# Patient Record
Sex: Female | Born: 1995
Health system: Southern US, Community
[De-identification: ages and names within clinical notes are randomized; demographics above are authoritative.]

## PROBLEM LIST (undated history)

## (undated) DIAGNOSIS — F329 Major depressive disorder, single episode, unspecified: Secondary | ICD-10-CM

## (undated) DIAGNOSIS — F419 Anxiety disorder, unspecified: Secondary | ICD-10-CM

## (undated) DIAGNOSIS — R569 Unspecified convulsions: Secondary | ICD-10-CM

## (undated) DIAGNOSIS — B029 Zoster without complications: Secondary | ICD-10-CM

## (undated) DIAGNOSIS — F32A Depression, unspecified: Secondary | ICD-10-CM

## (undated) DIAGNOSIS — D649 Anemia, unspecified: Secondary | ICD-10-CM

## (undated) DIAGNOSIS — Z8619 Personal history of other infectious and parasitic diseases: Secondary | ICD-10-CM

## (undated) DIAGNOSIS — F909 Attention-deficit hyperactivity disorder, unspecified type: Secondary | ICD-10-CM

## (undated) DIAGNOSIS — T7840XA Allergy, unspecified, initial encounter: Secondary | ICD-10-CM

## (undated) HISTORY — DX: Zoster without complications: B02.9

## (undated) HISTORY — DX: Anxiety disorder, unspecified: F41.9

## (undated) HISTORY — DX: Major depressive disorder, single episode, unspecified: F32.9

## (undated) HISTORY — DX: Depression, unspecified: F32.A

## (undated) HISTORY — DX: Attention-deficit hyperactivity disorder, unspecified type: F90.9

## (undated) HISTORY — DX: Personal history of other infectious and parasitic diseases: Z86.19

## (undated) HISTORY — DX: Unspecified convulsions: R56.9

## (undated) HISTORY — DX: Allergy, unspecified, initial encounter: T78.40XA

## (undated) HISTORY — PX: BREAST SURGERY: SHX581

## (undated) HISTORY — DX: Anemia, unspecified: D64.9

## (undated) HISTORY — PX: TONSILLECTOMY: SUR1361

---
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
## 2014-03-20 ENCOUNTER — Ambulatory Visit (INDEPENDENT_AMBULATORY_CARE_PROVIDER_SITE_OTHER): Payer: BC Managed Care – PPO | Admitting: Internal Medicine

## 2014-03-20 ENCOUNTER — Encounter: Payer: Self-pay | Admitting: Internal Medicine

## 2014-03-20 VITALS — BP 98/70 | HR 76 | Temp 98.4°F | Ht 66.25 in | Wt 120.0 lb

## 2014-03-20 DIAGNOSIS — A6000 Herpesviral infection of urogenital system, unspecified: Secondary | ICD-10-CM | POA: Insufficient documentation

## 2014-03-20 DIAGNOSIS — N920 Excessive and frequent menstruation with regular cycle: Secondary | ICD-10-CM

## 2014-03-20 DIAGNOSIS — N921 Excessive and frequent menstruation with irregular cycle: Secondary | ICD-10-CM | POA: Insufficient documentation

## 2014-03-20 LAB — CBC WITH DIFFERENTIAL/PLATELET
BASOS ABS: 0 10*3/uL (ref 0.0–0.1)
Basophils Relative: 0.2 % (ref 0.0–3.0)
Eosinophils Absolute: 0.1 10*3/uL (ref 0.0–0.7)
Eosinophils Relative: 2 % (ref 0.0–5.0)
HCT: 40 % (ref 36.0–49.0)
HEMOGLOBIN: 13.6 g/dL (ref 12.0–16.0)
LYMPHS PCT: 32.6 % (ref 24.0–48.0)
Lymphs Abs: 2.3 10*3/uL (ref 0.7–4.0)
MCHC: 33.9 g/dL (ref 31.0–37.0)
MCV: 87.7 fl (ref 78.0–98.0)
Monocytes Absolute: 0.6 10*3/uL (ref 0.1–1.0)
Monocytes Relative: 9.1 % (ref 3.0–12.0)
NEUTROS ABS: 4 10*3/uL (ref 1.4–7.7)
NEUTROS PCT: 56.1 % (ref 43.0–71.0)
Platelets: 243 10*3/uL (ref 150.0–575.0)
RBC: 4.56 Mil/uL (ref 3.80–5.70)
RDW: 12.6 % (ref 11.4–15.5)
WBC: 7.1 10*3/uL (ref 4.5–13.5)

## 2014-03-20 LAB — FERRITIN: Ferritin: 27.9 ng/mL (ref 10.0–291.0)

## 2014-03-20 LAB — COMPREHENSIVE METABOLIC PANEL
ALBUMIN: 4 g/dL (ref 3.5–5.2)
ALK PHOS: 81 U/L (ref 47–119)
ALT: 23 U/L (ref 0–35)
AST: 39 U/L — AB (ref 0–37)
BUN: 9 mg/dL (ref 6–23)
CALCIUM: 9.3 mg/dL (ref 8.4–10.5)
CO2: 26 mEq/L (ref 19–32)
Chloride: 105 mEq/L (ref 96–112)
Creatinine, Ser: 0.5 mg/dL (ref 0.4–1.2)
GFR: 154.61 mL/min (ref >60.00)
Glucose, Bld: 77 mg/dL (ref 70–99)
POTASSIUM: 3.7 meq/L (ref 3.5–5.1)
Sodium: 137 mEq/L (ref 135–145)
Total Bilirubin: 0.5 mg/dL (ref 0.3–1.2)
Total Protein: 6.5 g/dL (ref 6.0–8.3)

## 2014-03-20 LAB — TSH: TSH: 1.23 u[IU]/mL (ref 0.40–5.00)

## 2014-03-20 MED ORDER — ACYCLOVIR 800 MG PO TABS: 800.0000 mg | ORAL_TABLET | Freq: Two times a day (BID) | ORAL | Status: DC

## 2014-03-20 NOTE — Assessment & Plan Note (Addendum)
Likely menorrhagia related to decreased estrogen in Nexplanon, as this needs replaced. Will set up follow up with her OB. Will also request records from OB. Will check CBC and ferritin with labs.

## 2014-03-20 NOTE — Progress Notes (Signed)
Pre visit review using our clinic review tool, if applicable. No additional management support is needed unless otherwise documented below in the visit note. 

## 2014-03-20 NOTE — Patient Instructions (Addendum)
We will set up a visit with [facility].  Labs today.  Follow up in 4 weeks.

## 2014-03-20 NOTE — Progress Notes (Signed)
Subjective:    Patient ID: Laura Johnson, female    DOB: February 15, 1996, 18 y.o.   MRN: [id number]  HPI 17YO female presents to establish care as a new patient.  Has Nexplanon in place. Due to come out this October. Having heavy bleeding over the last several months. Feels tired and having some occasional aching headaches.  Has some occasional abdominal distension after eating. No specific food triggers. No constipation or diarrhea. No blood in stool.  Review of Systems  Constitutional: Negative for fever, chills, appetite change, fatigue and unexpected weight change.  Eyes: Negative for visual disturbance.  Respiratory: Negative for shortness of breath.   Cardiovascular: Negative for chest pain and leg swelling.  Gastrointestinal: Positive for abdominal distention. Negative for nausea, vomiting, abdominal pain, diarrhea and constipation.  Genitourinary: Positive for menstrual problem. Negative for dysuria, vaginal discharge, genital sores, vaginal pain and pelvic pain.  Skin: Negative for color change and rash.  Hematological: Negative for adenopathy. Does not bruise/bleed easily.  Psychiatric/Behavioral: Negative for dysphoric mood. The patient is not nervous/anxious.        Objective:    BP 98/70  Pulse 76  Temp(Src) 98.4 F (36.9 C) (Oral)  Ht 5' 6.25" (1.683 m)  Wt 120 lb (54.432 kg)  BMI 19.22 kg/m2  SpO2 95%  LMP 03/15/2014 Physical Exam  Constitutional: She is oriented to person, place, and time. She appears well-developed and well-nourished. No distress.  HENT:  Head: Normocephalic and atraumatic.  Right Ear: External ear normal.  Left Ear: External ear normal.  Nose: Nose normal.  Mouth/Throat: Oropharynx is clear and moist. No oropharyngeal exudate.  Eyes: Conjunctivae and EOM are normal. Pupils are equal, round, and reactive to light. Right eye exhibits no discharge.  Neck: Normal range of motion. Neck supple. No thyromegaly present.  Cardiovascular: Normal  rate, regular rhythm, normal heart sounds and intact distal pulses.  Exam reveals no gallop and no friction rub.   No murmur heard. Pulmonary/Chest: Effort normal. No respiratory distress. She has no wheezes. She has no rales.  Abdominal: Soft. Bowel sounds are normal. She exhibits no distension and no mass. There is no tenderness. There is no rebound and no guarding.  Musculoskeletal: Normal range of motion. She exhibits no edema and no tenderness.  Lymphadenopathy:    She has no cervical adenopathy.  Neurological: She is alert and oriented to person, place, and time. No cranial nerve deficit. Coordination normal.  Skin: Skin is warm and dry. No rash noted. She is not diaphoretic. No erythema. No pallor.  Psychiatric: She has a normal mood and affect. Her behavior is normal. Judgment and thought content normal.          Assessment & Plan:   Problem List Items Addressed This Visit     Unprioritized   Herpes genitalis     Symptoms well controlled with prn Acyclovir. Will continue.    Relevant Medications      acyclovir (ZOVIRAX) tablet   Menorrhagia with irregular cycle - Primary     Likely menorrhagia related to decreased estrogen in Nexplanon, as this needs replaced. Will set up follow up with her OB. Will also request records from OB.    Relevant Orders      CBC w/Diff      Ferritin      Comprehensive metabolic panel      TSH      Ambulatory referral to Gynecology       Return in about 4 weeks (around 04/17/2014) for  Recheck.

## 2014-03-20 NOTE — Assessment & Plan Note (Signed)
Symptoms well controlled with prn Acyclovir. Will continue.

## 2014-04-18 ENCOUNTER — Ambulatory Visit: Payer: BC Managed Care – PPO | Admitting: Internal Medicine

## 2014-05-29 ENCOUNTER — Ambulatory Visit: Payer: BC Managed Care – PPO | Admitting: Internal Medicine

## 2014-06-04 ENCOUNTER — Encounter: Payer: Self-pay | Admitting: Internal Medicine

## 2014-06-25 ENCOUNTER — Telehealth: Payer: Self-pay | Admitting: Internal Medicine

## 2014-06-25 NOTE — Telephone Encounter (Signed)
[facility] [employer] [facility]. Labs show normal blood counts and HCG is positive, consistent with pregnancy at 4-5 weeks. Can you please make sure that pt received these results from Tammy? We can set up OB referral if she needs this.

## 2014-06-25 NOTE — Telephone Encounter (Signed)
Left vm for pt to return my call.  

## 2014-06-26 ENCOUNTER — Encounter: Payer: Self-pay | Admitting: Internal Medicine

## 2014-06-26 NOTE — Telephone Encounter (Signed)
Pt sent mychart for results. Responded back through [employer]

## 2014-07-06 ENCOUNTER — Emergency Department: Payer: Self-pay | Admitting: Emergency Medicine

## 2014-07-07 LAB — CBC WITH DIFFERENTIAL/PLATELET
BASOS ABS: 0.1 10*3/uL (ref 0.0–0.1)
BASOS PCT: 0.6 %
Eosinophil #: 0.1 10*3/uL (ref 0.0–0.7)
Eosinophil %: 1.2 %
HCT: 40.3 % (ref 35.0–47.0)
HGB: 13.5 g/dL (ref 12.0–16.0)
LYMPHS PCT: 34.5 %
Lymphocyte #: 3.4 10*3/uL (ref 1.0–3.6)
MCH: 29.1 pg (ref 26.0–34.0)
MCHC: 33.5 g/dL (ref 32.0–36.0)
MCV: 87 fL (ref 80–100)
MONOS PCT: 8.4 %
Monocyte #: 0.8 x10 3/mm (ref 0.2–0.9)
NEUTROS PCT: 55.3 %
Neutrophil #: 5.4 10*3/uL (ref 1.4–6.5)
Platelet: 244 10*3/uL (ref 150–440)
RBC: 4.64 10*6/uL (ref 3.80–5.20)
RDW: 13 % (ref 11.5–14.5)
WBC: 9.7 10*3/uL (ref 3.6–11.0)

## 2014-07-07 LAB — HCG, QUANTITATIVE, PREGNANCY: BETA HCG, QUANT.: 17185 m[IU]/mL — AB

## 2014-07-11 NOTE — L&D Delivery Note (Signed)
VAGINAL DELIVERY NOTE:  Date of Delivery: 03/03/2015 Primary OB: [facility] Gestational Age/EDD: [redacted]w[redacted]d 02/26/2015, Date entered prior to episode creation Antepartum complications: Pre-ecclampsia with mild features Attending Physician:Beasley Delivery Type: spontaneous vaginal delivery  Anesthesia: spinal Laceration: 1st degree x 3 sutures of perineum Episiotomy: none Placenta: spontaneous Intrapartum complications: PP bleeding necessitating PItocin IV wide open and Methergine 0.25 IM x 1 Estimated Blood Loss: 400 ml GBS: neg Procedure Details: After pushing with ant thick lip since 1545, the baby made some progress but, epidural has disconnected and no longer working Spinal was done and pt was able to push effectively and NSVD of viable female infant born at  wth Vtx, Cord noted and reduced and then recognized body cord, ant shoulder and post shoulder del at     . Body to mom's abd with CAN x 1 over the body with 1 wrap around the Rt side of chest. CCx2 and cut. Cord blood sent. Baby to warmer as she was crying x 1 but, looked stunned. Baby crying and doing well after evaluation.   Baby: Liveborn female, Apgars, weight 7 #, 15 oz, baby named Laura Johnson

## 2014-07-29 ENCOUNTER — Encounter: Payer: Self-pay | Admitting: Internal Medicine

## 2014-10-31 NOTE — Op Note (Signed)
PATIENT NAME:  Laura Johnson, Laura Johnson MR#:  [id number] DATE OF BIRTH:  1995-08-20  DATE OF PROCEDURE:  11/15/2012  PREOPERATIVE DIAGNOSIS: Chronic persistent severe tonsillitis.   POSTOPERATIVE DIAGNOSES:  Chronic persistent, severe tonsillitis.   PROCEDURE: Tonsillectomy.   SURGEON: Zackery BarefootJ. Madison Bertina Guthridge, M.D.   ANESTHESIA: General endotracheal.   OPERATIVE FINDINGS:  Large tonsils.  Tonsils were 3+, chronically and cryptically inflamed. No peritonsillar abscess.   DESCRIPTION OF THE PROCEDURE:  The patient was identified in the holding area and taken to the operating room and placed in the supine position.  After general endotracheal anesthesia, the table was turned 45 degrees and the patient was draped in the usual fashion for a tonsillectomy.  A mouth gag was inserted into the oral cavity and examination of the oropharynx showed the uvula was non-bifid.  There was no evidence of submucous cleft to the palate.  There were large tonsils.  Beginning on the left-hand side a tenaculum was used to grasp the tonsil and the Bovie cautery was used to dissect it free from the fossa.  In a similar fashion, the right tonsil was removed.  Meticulous hemostasis was achieved using the Bovie cautery.  With both tonsils removed and no active bleeding, 0.5% plain Marcaine was used to inject the anterior and posterior tonsillar pillars bilaterally.  A total of 4 mL was used.  The patient tolerated the procedure well and was awakened in the operating room and taken to the recovery room in stable condition.   CULTURES:  None.  SPECIMENS:  Tonsils.  ESTIMATED BLOOD LOSS:  Less than 10 ml.  ____________________________ J. Gertie BaronMadison Gibson Lad, MD jmc:rw D: 11/15/2012 15:36:00 ET T: 11/15/2012 16:03:36 ET JOB#: [id number]  cc: Zackery BarefootJ. Madison Tyara Dassow, MD, <Dictator> Wendee CoppJMADISON Alizza Sacra MD ELECTRONICALLY SIGNED 11/27/2012 6:22

## 2014-12-18 ENCOUNTER — Encounter: Payer: Self-pay | Admitting: *Deleted

## 2014-12-18 ENCOUNTER — Emergency Department
Admission: EM | Admit: 2014-12-18 | Discharge: 2014-12-18 | Disposition: A | Discharge status: Home / Self Care | Payer: Medicaid Other | Attending: Emergency Medicine | Admitting: Emergency Medicine

## 2014-12-18 ENCOUNTER — Other Ambulatory Visit: Payer: Self-pay

## 2014-12-18 DIAGNOSIS — Z87891 Personal history of nicotine dependence: Secondary | ICD-10-CM | POA: Diagnosis not present

## 2014-12-18 DIAGNOSIS — O9989 Other specified diseases and conditions complicating pregnancy, childbirth and the puerperium: Secondary | ICD-10-CM | POA: Diagnosis present

## 2014-12-18 DIAGNOSIS — O212 Late vomiting of pregnancy: Secondary | ICD-10-CM | POA: Insufficient documentation

## 2014-12-18 DIAGNOSIS — R42 Dizziness and giddiness: Secondary | ICD-10-CM | POA: Insufficient documentation

## 2014-12-18 DIAGNOSIS — Z3A30 30 weeks gestation of pregnancy: Secondary | ICD-10-CM | POA: Insufficient documentation

## 2014-12-18 DIAGNOSIS — R112 Nausea with vomiting, unspecified: Secondary | ICD-10-CM

## 2014-12-18 LAB — URINALYSIS COMPLETE WITH MICROSCOPIC (ARMC ONLY)
BILIRUBIN URINE: NEGATIVE
GLUCOSE, UA: NEGATIVE mg/dL
Hgb urine dipstick: NEGATIVE
Ketones, ur: NEGATIVE mg/dL
NITRITE: NEGATIVE
Protein, ur: 30 mg/dL — AB
Specific Gravity, Urine: 1.019 (ref 1.005–1.030)
pH: 7 (ref 5.0–8.0)

## 2014-12-18 LAB — CBC
HCT: 37 % (ref 35.0–47.0)
HEMOGLOBIN: 12.6 g/dL (ref 12.0–16.0)
MCH: 30.5 pg (ref 26.0–34.0)
MCHC: 34 g/dL (ref 32.0–36.0)
MCV: 89.5 fL (ref 80.0–100.0)
Platelets: 293 10*3/uL (ref 150–440)
RBC: 4.13 MIL/uL (ref 3.80–5.20)
RDW: 12.8 % (ref 11.5–14.5)
WBC: 16.8 10*3/uL — ABNORMAL HIGH (ref 3.6–11.0)

## 2014-12-18 LAB — BASIC METABOLIC PANEL
ANION GAP: 9 (ref 5–15)
BUN: 5 mg/dL — ABNORMAL LOW (ref 6–20)
CALCIUM: 8.8 mg/dL — AB (ref 8.9–10.3)
CHLORIDE: 105 mmol/L (ref 101–111)
CO2: 23 mmol/L (ref 22–32)
Creatinine, Ser: 0.55 mg/dL (ref 0.44–1.00)
GFR calc Af Amer: >60 mL/min (ref >60)
GFR calc non Af Amer: >60 mL/min (ref >60)
Glucose, Bld: 86 mg/dL (ref 65–99)
POTASSIUM: 3.7 mmol/L (ref 3.5–5.1)
Sodium: 137 mmol/L (ref 135–145)

## 2014-12-18 LAB — GLUCOSE, CAPILLARY: GLUCOSE-CAPILLARY: 80 mg/dL (ref 65–99)

## 2014-12-18 MED ORDER — PROMETHAZINE HCL 25 MG PO TABS
25.0000 mg | ORAL_TABLET | Freq: Once | ORAL | Status: AC
  Administered 2014-12-18: 25 mg via ORAL

## 2014-12-18 MED ORDER — PROMETHAZINE HCL 25 MG PO TABS
ORAL_TABLET | ORAL | Status: AC
  Administered 2014-12-18: 25 mg via ORAL
  Filled 2014-12-18: qty 1

## 2014-12-18 NOTE — ED Notes (Signed)
Fetal HR 150

## 2014-12-18 NOTE — ED Provider Notes (Signed)
[facility] Emergency Department Provider Note   ____________________________________________  Time seen:20/50 I have reviewed the triage vital signs and the triage nursing note.  HISTORY  Chief Complaint Dizziness   Historian patient  HPI Laura Johnson is a 19 y.o. female who is currently [redacted] weeks pregnant and woke up this morning feeling some lightheadedness and dizziness. She did eat a meal and then felt nauseated and threw it up. She has vomited multiple times today. This is been nonbilious and nonbloody. She's not had a fever. She's not had any abdominal pain or cramping. She's had no vaginal complaints. She's had no constipationor diarrhea. She did have nausea earlier pregnancy which subsided around 15 weeks. She has had no change infetal movement and she has felt the baby move. Symptoms are moderate.no known sick contacts or bad food   Past Medical History  Diagnosis Date  . History of chicken pox   . Depression   . ADHD (attention deficit hyperactivity disorder)   . Shingles     right trunk  . Seizures     elementary school    Patient Active Problem List   Diagnosis Date Noted  . Menorrhagia with irregular cycle 03/20/2014  . Herpes genitalis 03/20/2014    Past Surgical History  Procedure Laterality Date  . Tonsillectomy and adenoidectomy  2014    Dr. Chestine Spore    Current Outpatient Rx  Name  Route  Sig  Dispense  Refill  . acyclovir (ZOVIRAX) 800 MG tablet   Oral   Take 1 tablet (800 mg total) by mouth 2 (two) times daily.   180 tablet   3     Allergies Review of patient's allergies indicates no known allergies.  Family History  Problem Relation Age of Onset  . Arthritis Mother   . Cancer Maternal Grandmother     Leukemia     Social History History  Substance Use Topics  . Smoking status: Former Games developer  . Smokeless tobacco: Not on file  . Alcohol Use: No    Review of Systems  Constitutional: Negative for  fever. Eyes: Negative for visual changes. ENT: Negative for sore throat. Cardiovascular: Negative for chest pain. Respiratory: Negative for shortness of breath. Gastrointestinal: Negative for abdominal pain and diarrhea. Genitourinary: Negative for dysuria. Musculoskeletal: Negative for back pain. Skin: Negative for rash. Neurological: Negative for headaches, focal weakness or numbness.  ____________________________________________   PHYSICAL EXAM:  VITAL SIGNS: ED Triage Vitals  Enc Vitals Group     BP 12/18/14 1924 117/75 mmHg     Pulse Rate 12/18/14 1924 86     Resp 12/18/14 1924 16     Temp 12/18/14 1924 98 F (36.7 C)     Temp Source 12/18/14 1924 Oral     SpO2 12/18/14 1924 100 %     Weight 12/18/14 1924 157 lb (71.215 kg)     Height 12/18/14 1924 5\' 6"  (1.676 m)     Head Cir --      Peak Flow --      Pain Score 12/18/14 2117 2     Pain Loc --      Pain Edu? --      Excl. in GC? --      Constitutional: Alert and oriented. Well appearing and in no distress. Eyes: Conjunctivae are normal. PERRL. Normal extraocular movements. ENT   Head: Normocephalic and atraumatic.   Nose: No congestion/rhinnorhea.   Mouth/Throat: Mucous membranes are moist.   Neck: No stridor. Cardiovascular: Normal rate,  regular rhythm.  No murmurs, rubs, or gallops. Respiratory: Normal respiratory effort without tachypnea nor retractions. Breath sounds are clear and equal bilaterally. No wheezes/rales/rhonchi. Gastrointestinal: Soft. No distention, no guarding, no rebound.nontender abdomen. Gravid uterus near the rib cage.  Genitourinary/rectal:deferred Musculoskeletal: Nontender with normal range of motion in all extremities. No joint effusions.  No lower extremity tenderness nor edema. Neurologic:  Normal speech and language. No gross focal neurologic deficits are appreciated. Skin:  Skin is warm, dry and intact. No rash noted. Psychiatric: Mood and affect are normal. Speech  and behavior are normal. Patient exhibits appropriate insight and judgment.  ____________________________________________   EKG  I, Governor Rooks, MD, the attending physician have personally viewed and interpreted this ECG.   82 bpm. Normal sinus rhythm with sinus arrhythmia. Narrow QRS. Normal axis. Nonspecific T-wave. QTC 446. No evidence for Brugada or Wolff-Parkinson-White. ____________________________________________  LABS (pertinent positives/negatives)  Urinalysis has 6-30 squamous epithelial cells with rare bacteria, 0-5 white blood cells and red blood cells and trace leukocytes with negative nitrites and negative ketones CBC shows white blood cell count 16.8 Metabolic panel without significant abnormality  ____________________________________________  RADIOLOGY Radiologist results reviewed  none __________________________________________  PROCEDURES  Procedure(s) performed: None Critical Care performed: None  ____________________________________________   ED COURSE / ASSESSMENT AND PLAN  Pertinent labs & imaging results that were available during my care of the patient were reviewed by me and considered in my medical decision making (see chart for details).   Patient having recurrent vomiting today without any abdominal pain at all. She's not having any pelvic pain. She's not having any dysuria. Her urinalysis looked like contamination and since she's having no symptoms I am going to just sent a culture of this was discussed with the patient should consult with this plan. She's had no decreased fetal movement and fetal heart tones are normal here in the emergency Department. Patient was given a dose of Phenergan and then nausea was subsided and she was able to take liquids emergency department. Her white blood count is elevated to 16, this is nonspecific, and I don't think this is raising a suspicion for appendicitis or acute emergency abdominal condition at she's  having no pain there. We discussed at length return to the emergency department for any new or worsening condition and she'll follow up with her OB/GYN and her primary care physician.   ___________________________________________   FINAL CLINICAL IMPRESSION(S) / ED DIAGNOSES   Final diagnoses:  Non-intractable vomiting with nausea, vomiting of unspecified type      Governor Rooks, MD 12/18/14 2304

## 2014-12-18 NOTE — ED Notes (Signed)
Fetal heart tone 140's , mom  77 heart rate

## 2014-12-18 NOTE — Discharge Instructions (Signed)
Exam evaluation are reassuring tonight, after controlling nausea and allowing it to drink some fluids.. Your white blood count slightly elevated, this is nonspecific.  I do not suspect a surgical or medical abdominal emergency based on your examine evaluation tonight. Since her having no urinary symptoms, we did decide to send the urinalysis for a culture given the evidence of some skin contamination. Return to the emergency room for any new or worsening condition including any abdominal pain, fever, vomiting blood, black or bloody stools, or vomiting with any concern of dehydration including dry mouth.  Return for any concern about abdominal contractions, vaginal bleeding or discharge or fluid, or decreased baby movement.  Nausea and Vomiting Nausea means you feel sick to your stomach. Throwing up (vomiting) is a reflex where stomach contents come out of your mouth. HOME CARE   Take medicine as told by your doctor.  Do not force yourself to eat. However, you do need to drink fluids.  If you feel like eating, eat a normal diet as told by your doctor.  Eat rice, wheat, potatoes, bread, lean meats, yogurt, fruits, and vegetables.  Avoid high-fat foods.  Drink enough fluids to keep your pee (urine) clear or pale yellow.  Ask your doctor how to replace body fluid losses (rehydrate). Signs of body fluid loss (dehydration) include:  Feeling very thirsty.  Dry lips and mouth.  Feeling dizzy.  Dark pee.  Peeing less than normal.  Feeling confused.  Fast breathing or heart rate. GET HELP RIGHT AWAY IF:   You have blood in your throw up.  You have black or bloody poop (stool).  You have a bad headache or stiff neck.  You feel confused.  You have bad belly (abdominal) pain.  You have chest pain or trouble breathing.  You do not pee at least once every 8 hours.  You have cold, clammy skin.  You keep throwing up after 24 to 48 hours.  You have a fever. MAKE SURE YOU:    Understand these instructions.  Will watch your condition.  Will get help right away if you are not doing well or get worse. Document Released: 12/14/2007 Document Revised: 09/19/2011 Document Reviewed: 11/26/2010 [facility] Patient Information 2015 [place], [place]. This information is not intended to replace advice given to you by your health care provider. Make sure you discuss any questions you have with your health care provider.

## 2014-12-18 NOTE — ED Notes (Signed)
Pt reports that she has had dizziness and vomiting since waking up this morning. Pt is [redacted]weeks pregnant.

## 2015-03-02 ENCOUNTER — Inpatient Hospital Stay
Admission: EM | Admit: 2015-03-02 | Discharge: 2015-03-05 | DRG: 775 | Disposition: A | Discharge status: Home / Self Care | Payer: Medicaid Other | Attending: Obstetrics and Gynecology | Admitting: Obstetrics and Gynecology

## 2015-03-02 ENCOUNTER — Encounter: Payer: Self-pay | Admitting: *Deleted

## 2015-03-02 DIAGNOSIS — Z809 Family history of malignant neoplasm, unspecified: Secondary | ICD-10-CM

## 2015-03-02 DIAGNOSIS — F329 Major depressive disorder, single episode, unspecified: Secondary | ICD-10-CM | POA: Diagnosis present

## 2015-03-02 DIAGNOSIS — Z8261 Family history of arthritis: Secondary | ICD-10-CM

## 2015-03-02 DIAGNOSIS — Z3A40 40 weeks gestation of pregnancy: Secondary | ICD-10-CM | POA: Diagnosis present

## 2015-03-02 DIAGNOSIS — F909 Attention-deficit hyperactivity disorder, unspecified type: Secondary | ICD-10-CM | POA: Diagnosis present

## 2015-03-02 DIAGNOSIS — Z87891 Personal history of nicotine dependence: Secondary | ICD-10-CM | POA: Diagnosis not present

## 2015-03-02 DIAGNOSIS — O133 Gestational [pregnancy-induced] hypertension without significant proteinuria, third trimester: Principal | ICD-10-CM | POA: Diagnosis present

## 2015-03-02 DIAGNOSIS — O99344 Other mental disorders complicating childbirth: Secondary | ICD-10-CM | POA: Diagnosis present

## 2015-03-02 LAB — CBC WITH DIFFERENTIAL/PLATELET
Basophils Absolute: 0 10*3/uL (ref 0–0.1)
Basophils Relative: 0 %
EOS PCT: 0 %
Eosinophils Absolute: 0 10*3/uL (ref 0–0.7)
HCT: 32.9 % — ABNORMAL LOW (ref 35.0–47.0)
Hemoglobin: 10.6 g/dL — ABNORMAL LOW (ref 12.0–16.0)
LYMPHS ABS: 1.7 10*3/uL (ref 1.0–3.6)
LYMPHS PCT: 11 %
MCH: 27.5 pg (ref 26.0–34.0)
MCHC: 32.2 g/dL (ref 32.0–36.0)
MCV: 85.2 fL (ref 80.0–100.0)
MONO ABS: 0.9 10*3/uL (ref 0.2–0.9)
MONOS PCT: 6 %
Neutro Abs: 12.1 10*3/uL — ABNORMAL HIGH (ref 1.4–6.5)
Neutrophils Relative %: 83 %
Platelets: 259 10*3/uL (ref 150–440)
RBC: 3.86 MIL/uL (ref 3.80–5.20)
RDW: 14.7 % — AB (ref 11.5–14.5)
WBC: 14.7 10*3/uL — ABNORMAL HIGH (ref 3.6–11.0)

## 2015-03-02 LAB — PROTEIN / CREATININE RATIO, URINE
Creatinine, Urine: 140 mg/dL
Protein Creatinine Ratio: 0.34 mg/mg{Cre} — ABNORMAL HIGH (ref 0.00–0.15)
Total Protein, Urine: 48 mg/dL

## 2015-03-02 LAB — COMPREHENSIVE METABOLIC PANEL
ALBUMIN: 2.9 g/dL — AB (ref 3.5–5.0)
ALT: 13 U/L — ABNORMAL LOW (ref 14–54)
AST: 33 U/L (ref 15–41)
Alkaline Phosphatase: 192 U/L — ABNORMAL HIGH (ref 38–126)
Anion gap: 8 (ref 5–15)
BUN: 9 mg/dL (ref 6–20)
CHLORIDE: 106 mmol/L (ref 101–111)
CO2: 22 mmol/L (ref 22–32)
Calcium: 8.7 mg/dL — ABNORMAL LOW (ref 8.9–10.3)
Creatinine, Ser: 0.6 mg/dL (ref 0.44–1.00)
GFR calc Af Amer: >60 mL/min (ref >60)
GFR calc non Af Amer: >60 mL/min (ref >60)
GLUCOSE: 81 mg/dL (ref 65–99)
Potassium: 4.2 mmol/L (ref 3.5–5.1)
Sodium: 136 mmol/L (ref 135–145)
TOTAL PROTEIN: 6.4 g/dL — AB (ref 6.5–8.1)

## 2015-03-02 MED ORDER — BUTORPHANOL TARTRATE 1 MG/ML IJ SOLN
1.0000 mg | INTRAMUSCULAR | Status: DC | PRN
  Administered 2015-03-02: 1 mg via INTRAVENOUS
  Administered 2015-03-03: 2 mg via INTRAVENOUS
  Administered 2015-03-03 (×3): 1 mg via INTRAVENOUS
  Filled 2015-03-02: qty 2
  Filled 2015-03-02 (×2): qty 1

## 2015-03-02 MED ORDER — OXYTOCIN 40 UNITS IN LACTATED RINGERS INFUSION - SIMPLE MED
1.0000 m[IU]/min | INTRAVENOUS | Status: DC
  Administered 2015-03-02: 1 m[IU]/min via INTRAVENOUS

## 2015-03-02 MED ORDER — SODIUM CHLORIDE 0.9 % IJ SOLN
INTRAMUSCULAR | Status: AC
  Filled 2015-03-02: qty 50

## 2015-03-02 MED ORDER — TERBUTALINE SULFATE 1 MG/ML IJ SOLN: 0.2500 mg | Freq: Once | INTRAMUSCULAR | Status: DC | PRN

## 2015-03-02 MED ORDER — OXYTOCIN 40 UNITS IN LACTATED RINGERS INFUSION - SIMPLE MED
62.5000 mL/h | INTRAVENOUS | Status: DC
  Filled 2015-03-02 (×2): qty 1000

## 2015-03-02 MED ORDER — CITRIC ACID-SODIUM CITRATE 334-500 MG/5ML PO SOLN: 30.0000 mL | ORAL | Status: DC | PRN

## 2015-03-02 MED ORDER — PENICILLIN G POTASSIUM 5000000 UNITS IJ SOLR
5.0000 10*6.[IU] | Freq: Once | INTRAVENOUS | Status: DC
  Filled 2015-03-02: qty 5

## 2015-03-02 MED ORDER — BUTORPHANOL TARTRATE 1 MG/ML IJ SOLN: 1.0000 mg | INTRAMUSCULAR | Status: DC | PRN

## 2015-03-02 MED ORDER — LACTATED RINGERS IV SOLN: 500.0000 mL | INTRAVENOUS | Status: DC | PRN

## 2015-03-02 MED ORDER — OXYTOCIN 40 UNITS IN LACTATED RINGERS INFUSION - SIMPLE MED
62.5000 mL/h | INTRAVENOUS | Status: DC
  Administered 2015-03-03: 62.5 mL/h via INTRAVENOUS

## 2015-03-02 MED ORDER — PENICILLIN G POTASSIUM 5000000 UNITS IJ SOLR
2.5000 10*6.[IU] | INTRAVENOUS | Status: DC
  Filled 2015-03-02 (×12): qty 2.5

## 2015-03-02 MED ORDER — LACTATED RINGERS IV SOLN
INTRAVENOUS | Status: DC
  Administered 2015-03-02 – 2015-03-03 (×2): via INTRAVENOUS

## 2015-03-02 MED ORDER — ACETAMINOPHEN 325 MG PO TABS
650.0000 mg | ORAL_TABLET | ORAL | Status: DC | PRN
  Administered 2015-03-02 – 2015-03-03 (×2): 650 mg via ORAL
  Filled 2015-03-02 (×2): qty 2

## 2015-03-02 MED ORDER — LIDOCAINE HCL (PF) 1 % IJ SOLN
30.0000 mL | INTRAMUSCULAR | Status: AC | PRN
  Administered 2015-03-03: 2 mL via SUBCUTANEOUS

## 2015-03-02 MED ORDER — LABETALOL HCL 5 MG/ML IV SOLN: 20.0000 mg | INTRAVENOUS | Status: DC | PRN

## 2015-03-02 MED ORDER — ONDANSETRON HCL 4 MG/2ML IJ SOLN
4.0000 mg | Freq: Four times a day (QID) | INTRAMUSCULAR | Status: DC | PRN
  Administered 2015-03-03: 4 mg via INTRAVENOUS
  Filled 2015-03-02: qty 2

## 2015-03-02 MED ORDER — HYDRALAZINE HCL 20 MG/ML IJ SOLN: 10.0000 mg | Freq: Once | INTRAMUSCULAR | Status: DC | PRN

## 2015-03-02 MED ORDER — OXYTOCIN BOLUS FROM INFUSION
500.0000 mL | INTRAVENOUS | Status: DC
Start: 2015-03-02 — End: 2015-03-03

## 2015-03-02 NOTE — Progress Notes (Signed)
Laura Johnson is a 19 y.o. G1P0 at [redacted]w[redacted]d by LMP admitted for induction of labor due to mild preecclampsia without severe features.  Subjective: Hurting some  Objective: BP 135/94 mmHg  Pulse 76  Temp(Src) 98 F (36.7 C) (Oral)  Resp 16  Ht  (1.676 m)  Wt 83.915 kg (185 lb)  BMI 29.87 kg/m2  LMP 03/15/2014      FHT:  FHR: 150, + accels, no decels, Cat I bpm, variability: moderate,  accelerations:  Present,  decelerations:  Absent UC:   regular, every  2  minutes SVE:      Labs: Lab Results  Component Value Date   WBC 14.7* 03/02/2015   HGB 10.6* 03/02/2015   HCT 32.9* 03/02/2015   MCV 85.2 03/02/2015   PLT 259 03/02/2015    Assessment / Plan: Induction of labor due to preeclampsia,  progressing well on pitocin  Labor: Progressing on Pitocin, will continue to increase then AROM and foley bulb intact Preeclampsia:  mild without sevvere features Fetal Wellbeing:  Category I Pain Control:  Labor support without medications I/D:  n/a Anticipated MOD:  NSVD  Sharee Pimple 03/02/2015, 8:06 PM

## 2015-03-02 NOTE — Progress Notes (Addendum)
Laura Johnson is a 19 y.o. G1P0 at [redacted]w[redacted]d by LMP admitted for induction of labor due to Pre-ex without severe features however pt did have blurred vision yest..  Subjective:  Feeling well, feel some tightness Objective: BP 122/105 mmHg  Pulse 109  Temp(Src) 98.1 F (36.7 C) (Oral)  Resp 16  Ht  (1.676 m)  Wt 83.915 kg (185 lb)  BMI 29.87 kg/m2  LMP 03/15/2014      FHT:  FHR: 140, mod variability, Cat I bpm, variability: moderate,  accelerations:  Present,  decelerations:  Absent UC:   regular, every 2-3 minutes SVE:      Labs: Lab Results  Component Value Date   WBC 14.7* 03/02/2015   HGB 10.6* 03/02/2015   HCT 32.9* 03/02/2015   MCV 85.2 03/02/2015   PLT 259 03/02/2015    Assessment / Plan: Induction of labor due to preeclampsia and foley bulb placed and pitocin being started,  progressing well on pitocin  Labor: Progressing normally and Progressing on Pitocin, will continue to increase then AROM Preeclampsia:  no severe features Fetal Wellbeing:  Category I Pain Control:  Labor support without medications I/D:  n/a Anticipated MOD:  NSVD   Procedure: Risks, benefits and alternatives explained to pt including failed IOL, fetal or uterine intolerance, Pre-ecclampsia with no severe features, Prot/creat 340 today. No HA, RUQ pain, blurred vision yest none today. Foley bulb placed with 30 ml balloon via speculum and betadine prep done, Inserted with stylet and taped to Lt leg.   Milon Score W 03/02/2015, 1:41 PM  1/70%/vtx-2 Speculum exam done on pelvic, anal, cervical and no lesions present. Pt has Rt labial varicosity noted.

## 2015-03-02 NOTE — Progress Notes (Signed)
Laura Johnson is a 19 y.o. G1P0 at [redacted]w[redacted]d by LMP admitted for induction of labor due to pre-ec without severe features. Although, pt has a HA tonight but, has not eating all day.   Subjective: Hurting some in the back  Objective: BP 138/93 mmHg  Pulse 66  Temp(Src) 98 F (36.7 C) (Oral)  Resp 16  Ht  (1.676 m)  Wt 83.915 kg (185 lb)  BMI 29.87 kg/m2  LMP 03/15/2014      FHT:  FHR: 145, mod variability, + accels, 1 variable noted bpm, variability: moderate,  accelerations:  Present,  decelerations:  Present 1 varibable UC:   regular, every 2  minutes SVE:   Dilation: 3 Effacement (%): 100 Exam by:: Beatriz Stallion, CNM  Labs: Lab Results  Component Value Date   WBC 14.7* 03/02/2015   HGB 10.6* 03/02/2015   HCT 32.9* 03/02/2015   MCV 85.2 03/02/2015   PLT 259 03/02/2015    Assessment / Plan: Induction of labor due to Pre-ex without severe features,  progressing well on pitocin  Labor: Progressing normally and Progressing on Pitocin, will continue to increase then AROM Preeclampsia:  mild Fetal Wellbeing:  Category I Pain Control:  Labor support without medications I/D:  n/a Anticipated MOD:  NSVD  AROM for clear fluid at 2255 for mod amt. 150 after AROM. Foley bulb pulled out prior to cx exam.   Sharee Pimple 03/02/2015, 11:07 PM

## 2015-03-02 NOTE — Progress Notes (Signed)
Laura Johnson is a 19 y.o. G1P0 at [redacted]w[redacted]d by LMP admitted for induction of labor due to Gest HTN..  Subjective:  I had blurred vision yest, none today Objective: BP 122/105 mmHg  Pulse 109  Temp(Src) 98.1 F (36.7 C) (Oral)  Resp 16  Ht  (1.676 m)  Wt 83.915 kg (185 lb)  BMI 29.87 kg/m2  LMP 03/15/2014      FHT:  FHR: 140, mod variabilty, no decels bpm, variability: moderate,  accelerations:  Present,  decelerations:  Absent UC:   regular, every  3  minutes SVE:      Labs: Lab Results  Component Value Date   WBC 14.7* 03/02/2015   HGB 10.6* 03/02/2015   HCT 32.9* 03/02/2015   MCV 85.2 03/02/2015   PLT 259 03/02/2015    Assessment / Plan: Spontaneous labor, progressing normally  Labor: Progressing normally Preeclampsia:  Gest HTN, +blurred vision Fetal Wellbeing:  Category I Pain Control:  none I/D:  n/a Anticipated MOD:  NSVD  Sharee Pimple 03/02/2015, 12:18 PM

## 2015-03-02 NOTE — OB Triage Note (Signed)
REcvd from ER per wheelchair.  Was sent from office with complaints of blurred vision over the weekend.  B/P elevated in office.  Changed to gown and to bed.  Plan of care discussed, oriented to room .  EFM placed.

## 2015-03-02 NOTE — H&P (Signed)
Laura Johnson is a 19 y.o. female presenting for Gest HTN with BP 140/96 today and blurred vision yest, no HA or RUQ pain. Pt is 40 4/7 weeks today. Pt has had some elevated BP's during the pregnancy. Pt has not been on meds for BP.  Maternal Medical History:  Fetal activity: Perceived fetal activity is normal.      OB History    Gravida Para Term Preterm AB TAB SAB Ectopic Multiple Living   1              Past Medical History  Diagnosis Date  . History of chicken pox   . Depression   . ADHD (attention deficit hyperactivity disorder)   . Shingles     right trunk  . Seizures     elementary school   Past Surgical History  Procedure Laterality Date  . Tonsillectomy and adenoidectomy  2014    Dr. Chestine Spore  . Tonsillectomy     Family History: family history includes Arthritis in her mother; Cancer in her maternal grandmother. Social History:  reports that she has quit smoking. She does not have any smokeless tobacco history on file. She reports that she does not drink alcohol or use illicit drugs.   Prenatal Transfer Tool  Maternal Diabetes: No Genetic Screening: Normal Maternal Ultrasounds/Referrals: Normal Fetal Ultrasounds or other Referrals:  None Maternal Substance Abuse:  No Significant Maternal Medications:  None Significant Maternal Lab Results:  None Other Comments:  None  ROS    Blood pressure 122/105, pulse 109, temperature 98.1 F (36.7 C), temperature source Oral, resp. rate 16, height  (1.676 m), weight 83.915 kg (185 lb), last menstrual period 03/15/2014. Exam Physical Exam  Prenatal labs: ABO, Rh:   Antibody:   Rubella:   RPR:    HBsAg:    HIV:    GBS:     Assessment/Plan: A: IUP at 40 4/7 weeks with elevated BP's/blurred vision P: 1. Admit for OBS  2. Prot/creatine ratio today 3. Will decide if IOL is indicated today vs Friday.  Milon Score W 03/02/2015, 10:58 AM

## 2015-03-03 ENCOUNTER — Inpatient Hospital Stay: Payer: Medicaid Other | Admitting: Anesthesiology

## 2015-03-03 MED ORDER — ONDANSETRON HCL 4 MG PO TABS
4.0000 mg | ORAL_TABLET | ORAL | Status: DC | PRN
  Administered 2015-03-05: 4 mg via ORAL
  Filled 2015-03-03: qty 1

## 2015-03-03 MED ORDER — SODIUM CHLORIDE 0.9 % IJ SOLN: 3.0000 mL | INTRAMUSCULAR | Status: DC | PRN

## 2015-03-03 MED ORDER — TETANUS-DIPHTH-ACELL PERTUSSIS 5-2.5-18.5 LF-MCG/0.5 IM SUSP
0.5000 mL | Freq: Once | INTRAMUSCULAR | Status: DC
  Filled 2015-03-03: qty 0.5

## 2015-03-03 MED ORDER — BUPIVACAINE HCL (PF) 0.25 % IJ SOLN
INTRAMUSCULAR | Status: DC | PRN
  Administered 2015-03-03 (×2): 5 mL

## 2015-03-03 MED ORDER — LIDOCAINE HCL (PF) 1 % IJ SOLN
INTRAMUSCULAR | Status: AC
  Filled 2015-03-03: qty 30

## 2015-03-03 MED ORDER — PRENATAL MULTIVITAMIN CH
1.0000 | ORAL_TABLET | Freq: Every day | ORAL | Status: DC
  Administered 2015-03-05: 1 via ORAL
  Filled 2015-03-03: qty 1

## 2015-03-03 MED ORDER — WITCH HAZEL-GLYCERIN EX PADS: 1.0000 "application " | MEDICATED_PAD | CUTANEOUS | Status: DC | PRN

## 2015-03-03 MED ORDER — BUTORPHANOL TARTRATE 1 MG/ML IJ SOLN
2.0000 mg | Freq: Once | INTRAMUSCULAR | Status: AC
  Administered 2015-03-03: 2 mg via INTRAVENOUS

## 2015-03-03 MED ORDER — BUTORPHANOL TARTRATE 1 MG/ML IJ SOLN
INTRAMUSCULAR | Status: AC
  Administered 2015-03-03: 1 mg via INTRAVENOUS
  Filled 2015-03-03: qty 1

## 2015-03-03 MED ORDER — EPHEDRINE 5 MG/ML INJ
10.0000 mg | INTRAVENOUS | Status: DC | PRN
  Filled 2015-03-03: qty 2

## 2015-03-03 MED ORDER — PHENYLEPHRINE 40 MCG/ML (10ML) SYRINGE FOR IV PUSH (FOR BLOOD PRESSURE SUPPORT)
80.0000 ug | PREFILLED_SYRINGE | INTRAVENOUS | Status: DC | PRN
  Filled 2015-03-03: qty 2

## 2015-03-03 MED ORDER — ZOLPIDEM TARTRATE 5 MG PO TABS: 5.0000 mg | ORAL_TABLET | Freq: Every evening | ORAL | Status: DC | PRN

## 2015-03-03 MED ORDER — FLEET ENEMA 7-19 GM/118ML RE ENEM: 1.0000 | ENEMA | Freq: Every day | RECTAL | Status: DC | PRN

## 2015-03-03 MED ORDER — ACETAMINOPHEN 325 MG PO TABS
650.0000 mg | ORAL_TABLET | ORAL | Status: DC | PRN
  Administered 2015-03-03 – 2015-03-04 (×4): 650 mg via ORAL
  Filled 2015-03-03 (×4): qty 2

## 2015-03-03 MED ORDER — DIPHENHYDRAMINE HCL 25 MG PO CAPS: 25.0000 mg | ORAL_CAPSULE | Freq: Four times a day (QID) | ORAL | Status: DC | PRN

## 2015-03-03 MED ORDER — MISOPROSTOL 200 MCG PO TABS
ORAL_TABLET | ORAL | Status: AC
  Filled 2015-03-03: qty 4

## 2015-03-03 MED ORDER — SODIUM CHLORIDE 0.9 % IJ SOLN: 3.0000 mL | Freq: Two times a day (BID) | INTRAMUSCULAR | Status: DC

## 2015-03-03 MED ORDER — OXYTOCIN 10 UNIT/ML IJ SOLN
INTRAMUSCULAR | Status: AC
  Filled 2015-03-03: qty 2

## 2015-03-03 MED ORDER — DIBUCAINE 1 % RE OINT: 1.0000 "application " | TOPICAL_OINTMENT | RECTAL | Status: DC | PRN

## 2015-03-03 MED ORDER — ONDANSETRON HCL 4 MG/2ML IJ SOLN: 4.0000 mg | INTRAMUSCULAR | Status: DC | PRN

## 2015-03-03 MED ORDER — OXYCODONE-ACETAMINOPHEN 5-325 MG PO TABS
1.0000 | ORAL_TABLET | ORAL | Status: DC | PRN
  Administered 2015-03-05 (×2): 1 via ORAL
  Filled 2015-03-03 (×2): qty 1

## 2015-03-03 MED ORDER — METHYLERGONOVINE MALEATE 0.2 MG/ML IJ SOLN
INTRAMUSCULAR | Status: AC
  Administered 2015-03-03: 0.2 mg
  Filled 2015-03-03: qty 1

## 2015-03-03 MED ORDER — BUTORPHANOL TARTRATE 1 MG/ML IJ SOLN
INTRAMUSCULAR | Status: AC
  Administered 2015-03-03: 2 mg via INTRAVENOUS
  Filled 2015-03-03: qty 2

## 2015-03-03 MED ORDER — LANOLIN HYDROUS EX OINT: TOPICAL_OINTMENT | CUTANEOUS | Status: DC | PRN

## 2015-03-03 MED ORDER — LIDOCAINE-EPINEPHRINE (PF) 1.5 %-1:200000 IJ SOLN
INTRAMUSCULAR | Status: DC | PRN
  Administered 2015-03-03: 2 mL via PERINEURAL
  Administered 2015-03-03: 4 mL via PERINEURAL

## 2015-03-03 MED ORDER — BENZOCAINE-MENTHOL 20-0.5 % EX AERO
INHALATION_SPRAY | CUTANEOUS | Status: AC
  Administered 2015-03-03: 1
  Filled 2015-03-03: qty 56

## 2015-03-03 MED ORDER — AMMONIA AROMATIC IN INHA
RESPIRATORY_TRACT | Status: AC
  Filled 2015-03-03: qty 10

## 2015-03-03 MED ORDER — BENZOCAINE-MENTHOL 20-0.5 % EX AERO: 1.0000 "application " | INHALATION_SPRAY | CUTANEOUS | Status: DC | PRN

## 2015-03-03 MED ORDER — OXYTOCIN 40 UNITS IN LACTATED RINGERS INFUSION - SIMPLE MED: 62.5000 mL/h | INTRAVENOUS | Status: DC | PRN

## 2015-03-03 MED ORDER — FENTANYL 2.5 MCG/ML W/ROPIVACAINE 0.2% IN NS 100 ML EPIDURAL INFUSION (ARMC-ANES)
9.0000 mL/h | EPIDURAL | Status: DC
  Filled 2015-03-03: qty 100

## 2015-03-03 MED ORDER — SODIUM CHLORIDE 0.9 % IV SOLN: 250.0000 mL | INTRAVENOUS | Status: DC | PRN

## 2015-03-03 MED ORDER — IBUPROFEN 600 MG PO TABS
600.0000 mg | ORAL_TABLET | Freq: Four times a day (QID) | ORAL | Status: DC
  Administered 2015-03-03 – 2015-03-05 (×6): 600 mg via ORAL
  Filled 2015-03-03 (×6): qty 1

## 2015-03-03 MED ORDER — FENTANYL 2.5 MCG/ML W/ROPIVACAINE 0.2% IN NS 100 ML EPIDURAL INFUSION (ARMC-ANES)
EPIDURAL | Status: AC
  Administered 2015-03-03: 9 mL/h via EPIDURAL
  Filled 2015-03-03: qty 100

## 2015-03-03 MED ORDER — SENNOSIDES-DOCUSATE SODIUM 8.6-50 MG PO TABS
2.0000 | ORAL_TABLET | ORAL | Status: DC
  Administered 2015-03-05: 2 via ORAL
  Filled 2015-03-03: qty 2

## 2015-03-03 MED ORDER — MEASLES, MUMPS & RUBELLA VAC ~~LOC~~ INJ
0.5000 mL | INJECTION | Freq: Once | SUBCUTANEOUS | Status: AC
  Administered 2015-03-05: 0.5 mL via SUBCUTANEOUS
  Filled 2015-03-03: qty 0.5

## 2015-03-03 MED ORDER — OXYCODONE-ACETAMINOPHEN 5-325 MG PO TABS: 2.0000 | ORAL_TABLET | ORAL | Status: DC | PRN

## 2015-03-03 MED ORDER — BISACODYL 10 MG RE SUPP: 10.0000 mg | Freq: Every day | RECTAL | Status: DC | PRN

## 2015-03-03 MED ORDER — DIPHENHYDRAMINE HCL 50 MG/ML IJ SOLN: 12.5000 mg | INTRAMUSCULAR | Status: DC | PRN

## 2015-03-03 MED ORDER — SIMETHICONE 80 MG PO CHEW: 80.0000 mg | CHEWABLE_TABLET | ORAL | Status: DC | PRN

## 2015-03-03 NOTE — Anesthesia Procedure Notes (Addendum)
Epidural Patient location during procedure: OB  Preanesthetic Checklist Completed: patient identified, site marked, surgical consent, pre-op evaluation, timeout performed, IV checked, risks and benefits discussed and monitors and equipment checked  Epidural Patient position: sitting Prep: Betadine Patient monitoring: heart rate, continuous pulse ox and blood pressure Approach: midline Location: L4-L5 Injection technique: LOR air  Needle:  Needle type: Tuohy  Needle gauge: 18 G Needle length: 9 cm and 9 Needle insertion depth: 5 cm Catheter type: closed end flexible Catheter size: 20 Guage Catheter at skin depth: 10 cm Test dose: negative and 1.5% lidocaine with Epi 1:200 K  Assessment Sensory level: T10 Events: blood not aspirated, injection not painful, no injection resistance, negative IV test and no paresthesia  Additional Notes Pt's history reviewed and consent obtained as per OB consent Patient tolerated the insertion well without complications. Negative SATD, negative IVTD All VSS were obtained and monitored through OBIX and nursing protocols followed.Reason for block:procedure for pain  Epidural Patient location during procedure: OB Start time: 03/03/2015 11:25 AM End time: 03/03/2015 11:45 AM  Staffing Anesthesiologist: Elijio Miles F  Preanesthetic Checklist Completed: patient identified, site marked, surgical consent, pre-op evaluation, timeout performed, IV checked, risks and benefits discussed, monitors and equipment checked and at surgeon's request  Epidural Patient position: sitting Prep: Betadine Patient monitoring: heart rate and blood pressure Approach: midline Location: L3-L4 Injection technique: LOR air and LOR saline  Needle:  Needle type: Tuohy  Needle gauge: 18 G Needle length: 9 cm Needle insertion depth: 4 cm Catheter type: closed end flexible Catheter size: 20 Guage Catheter at skin depth: 8 cm Test dose: 2% lidocaine  with Epi 1:200 K and negative  Assessment Sensory level: T8  Additional Notes Reason for block:at surgeon's request  Spinal Patient location during procedure: OB Start time: 03/03/2015 5:56 PM End time: 03/03/2015 6:01 PM Reason for block: procedure for pain Staffing Anesthesiologist: Lenard Simmer Performed by: anesthesiologist  Preanesthetic Checklist Completed: patient identified, site marked, surgical consent, pre-op evaluation, timeout performed, IV checked, risks and benefits discussed and monitors and equipment checked Spinal Block Patient position: sitting Prep: ChloraPrep Patient monitoring: heart rate, continuous pulse ox and blood pressure Approach: midline Injection technique: single-shot Needle Needle type: Spinocan  Needle gauge: 27 G Needle length: 10 cm

## 2015-03-03 NOTE — Progress Notes (Signed)
Epidural catheter line found to be pulled out from filter piece. Line knotted to prevent infection. Anesthesia notified. To come assess.

## 2015-03-03 NOTE — Progress Notes (Signed)
Laura Johnson is a 19 y.o. G1P0 at [redacted]w[redacted]d by LMP admitted for Pre-ecclampsia. Pt is pushing well with a OP baby with some progress after 1 hour of pushing.   Subjective: It is hurting and my back is killing me  Objective: BP 142/97 mmHg  Pulse 93  Temp(Src) 98.5 F (36.9 C) (Oral)  Resp 16  Ht  (1.676 m)  Wt 83.915 kg (185 lb)  BMI 29.87 kg/m2  SpO2 99%  LMP 03/15/2014      FHT:  FHR: 150, +accels, no decels bpm, variability: moderate,  accelerations:  Present,  decelerations:  Absent UC:   regular, every  3 minutes SVE:   Dilation: 6.5 Effacement (%): 70 Station: 0 Exam by:: AEd  Labs: Lab Results  Component Value Date   WBC 14.7* 03/02/2015   HGB 10.6* 03/02/2015   HCT 32.9* 03/02/2015   MCV 85.2 03/02/2015   PLT 259 03/02/2015    Assessment / Plan: 1. IUP  AT TERM 2. PRE-EX WITHOUT SEVERE FEATURES  Labor: Continue pushing Preeclampsia:  mild Fetal Wellbeing:  Category I Pain Control:  Epidural I/D:  n/a Anticipated MOD:  if no further progress, will call Dr Bishop Dublin, Barbaraann Rondo 03/03/2015, 5:06 PM

## 2015-03-03 NOTE — Anesthesia Preprocedure Evaluation (Signed)
Anesthesia Evaluation  Patient identified by MRN, date of birth, ID band Patient awake    Reviewed: Allergy & Precautions, H&P , NPO status , Patient's Chart, lab work & pertinent test results, reviewed documented beta blocker date and time   Airway Mallampati: III  TM Distance: >3 FB Neck ROM: full    Dental no notable dental hx. (+) Teeth Intact   Pulmonary neg pulmonary ROS, former smoker,  breath sounds clear to auscultation  Pulmonary exam normal       Cardiovascular Exercise Tolerance: Good negative cardio ROS Normal cardiovascular examRhythm:regular Rate:Normal     Neuro/Psych PSYCHIATRIC DISORDERS negative neurological ROS  negative psych ROS   GI/Hepatic negative GI ROS, Neg liver ROS,   Endo/Other  negative endocrine ROS  Renal/GU negative Renal ROS  negative genitourinary   Musculoskeletal   Abdominal   Peds  Hematology negative hematology ROS (+)   Anesthesia Other Findings   Reproductive/Obstetrics (+) Pregnancy                             Anesthesia Physical Anesthesia Plan  ASA: II  Anesthesia Plan: Regional and Epidural   Post-op Pain Management:    Induction:   Airway Management Planned:   Additional Equipment:   Intra-op Plan:   Post-operative Plan:   Informed Consent: I have reviewed the patients History and Physical, chart, labs and discussed the procedure including the risks, benefits and alternatives for the proposed anesthesia with the patient or authorized representative who has indicated his/her understanding and acceptance.     Plan Discussed with: CRNA  Anesthesia Plan Comments:         Anesthesia Quick Evaluation

## 2015-03-03 NOTE — Progress Notes (Signed)
Laura Johnson is a 19 y.o. G1P0 at [redacted]w[redacted]d by LMP admitted for IOL due to pre-ecclampsia.   Subjective: I feel like I am dying I am hurting so much  Objective: BP 142/97 mmHg  Pulse 93  Temp(Src) 98.5 F (36.9 C) (Oral)  Resp 16  Ht  (1.676 m)  Wt 83.915 kg (185 lb)  BMI 29.87 kg/m2  SpO2 99%  LMP 03/15/2014      FHT:  FHR: 155, +accels, Tracing with some diff due to hands and knees pos bpm, variability: moderate,  accelerations:  Present,  decelerations:  Absent UC:   regular, every 3-5  minutes SVE:   Dilation: 6.5 Effacement (%): 70 Station: 0 Exam by:: AEd  Labs: Lab Results  Component Value Date   WBC 14.7* 03/02/2015   HGB 10.6* 03/02/2015   HCT 32.9* 03/02/2015   MCV 85.2 03/02/2015   PLT 259 03/02/2015    Assessment / Plan: Arrest of decent  Labor: baby is not moving down with 2 hours of pushing Preeclampsia:  no severe features Fetal Wellbeing:  Category I Pain Control:  Spinal being done I/D:  n/a Anticipated MOD:  NSVD if baby rotates and if not, Laura Johnson, Laura Johnson 03/03/2015, 5:44 PM

## 2015-03-03 NOTE — Progress Notes (Signed)
Previous epidural removed and replaced by Dr. Mordecai Rasmussen. Positioned on left side

## 2015-03-03 NOTE — Progress Notes (Signed)
Laura Johnson is a 19 y.o. G1P0 at [redacted]w[redacted]d  admitted for induction of labor due to Hypertension.  Subjective: Pt uncomfortable despite epidural, movement helping.   Objective: BP 119/58 mmHg  Pulse 106  Temp(Src) 98.5 F (36.9 C) (Oral)  Resp 16  Ht  (1.676 m)  Wt 83.915 kg (185 lb)  BMI 29.87 kg/m2  SpO2 99%  LMP 03/15/2014      FHT:  FHR: 140 bpm, variability: moderate,  accelerations:  Present,  decelerations:  Absent UC:   regular, every 2-3 minutes SVE:   Dilation: 4.5 Effacement (%): 80 Station: -2 Exam by:: AED  Labs: Lab Results  Component Value Date   WBC 14.7* 03/02/2015   HGB 10.6* 03/02/2015   HCT 32.9* 03/02/2015   MCV 85.2 03/02/2015   PLT 259 03/02/2015    Assessment / Plan: Induction of labor due to gestational hypertension,  progressing well on pitocin  Labor: Progressing on Pitocin, IUPC In place. Preeclampsia:  stable Fetal Wellbeing:  Category I Pain Control:  Epidural I/D:  n/a Anticipated MOD:  NSVD   Labor slow, but progression with reassuring fetal monitoring. Pt desires delivery with CNM.  Laura Johnson 03/03/2015, 8:06 AM

## 2015-03-03 NOTE — Progress Notes (Signed)
Bryton Waight is a 19 y.o. G1P0 at [redacted]w[redacted]d by LMP admitted for Pre-ex without severe features.  Subjective: Feeling more comfortable with UC's now. Pt had new epidural after being in multiple positions to rotate the baby from OP to OA.   Objective: BP 147/90 mmHg  Pulse 118  Temp(Src) 98.5 F (36.9 C) (Oral)  Resp 16  Ht  (1.676 m)  Wt 83.915 kg (185 lb)  BMI 29.87 kg/m2  SpO2 99%  LMP 03/15/2014      FHT:  FHR: Cat 1, + accels, no decels, mod variability bpm, variability: moderate,  accelerations:  Present,  decelerations:  Absent UC:   regular, every 3  Minutes, MVU's 200 SVE:   Dilation: 6 Effacement (%): 70 Station: 0 Exam by:: AED Cervix is less edematous after 2nd epidural Labs: Lab Results  Component Value Date   WBC 14.7* 03/02/2015   HGB 10.6* 03/02/2015   HCT 32.9* 03/02/2015   MCV 85.2 03/02/2015   PLT 259 03/02/2015    Assessment / Plan: Induction of labor due to preeclampsia,  progressing well on pitocin  Labor: Adequate MVU's 200 on PItocin which was backed down and effective now Preeclampsia:  labs stable Fetal Wellbeing:  Category I Pain Control:  Epidural I/D:  n/a Anticipated MOD:  NSVD  Sharee Pimple 03/03/2015, 12:28 PM

## 2015-03-03 NOTE — Progress Notes (Signed)
Laura Johnson is a 19 y.o. G1P0 at [redacted]w[redacted]d by LMP admitted for Pre-ecclampsia,  Subjective:  Able to rest well on Lt side.  Objective: BP 142/97 mmHg  Pulse 93  Temp(Src) 98.5 F (36.9 C) (Oral)  Resp 16  Ht  (1.676 m)  Wt 83.915 kg (185 lb)  BMI 29.87 kg/m2  SpO2 99%  LMP 03/15/2014      FHT:  FHR: 150, +mod variability, +accels, no decels bpm, variability: moderate,  accelerations:  Present,  decelerations:  Absent UC:   regular, every 2 minutes, MVU's 220 SVE:   Dilation: 6 Effacement (%): 70 Station: 0 Exam by:: AED  Labs: Lab Results  Component Value Date   WBC 14.7* 03/02/2015   HGB 10.6* 03/02/2015   HCT 32.9* 03/02/2015   MCV 85.2 03/02/2015   PLT 259 03/02/2015    Assessment / Plan: Augmentation of labor, progressing well  Labor: Progressing well on Pitocin  Preeclampsia:  mild with some elevated pressures Fetal Wellbeing:  Category I Pain Control:  Epidural I/D:  n/a Anticipated MOD:  NSVD  Sharee Pimple 03/03/2015, 1:35 PM

## 2015-03-04 LAB — CBC
HCT: 26.3 % — ABNORMAL LOW (ref 35.0–47.0)
Hemoglobin: 8.4 g/dL — ABNORMAL LOW (ref 12.0–16.0)
MCH: 27.4 pg (ref 26.0–34.0)
MCHC: 32 g/dL (ref 32.0–36.0)
MCV: 85.6 fL (ref 80.0–100.0)
PLATELETS: 211 10*3/uL (ref 150–440)
RBC: 3.07 MIL/uL — ABNORMAL LOW (ref 3.80–5.20)
RDW: 14.9 % — AB (ref 11.5–14.5)
WBC: 29.7 10*3/uL — ABNORMAL HIGH (ref 3.6–11.0)

## 2015-03-04 LAB — CHLAMYDIA/NGC RT PCR (ARMC ONLY)
CHLAMYDIA TR: NOT DETECTED
N gonorrhoeae: NOT DETECTED

## 2015-03-04 NOTE — Progress Notes (Signed)
Post Partum Day 1 Subjective: no complaints  Objective: Blood pressure 130/83, pulse 80, temperature 98 F (36.7 C), temperature source Oral, resp. rate 18, height  (1.676 m), weight 185 lb (83.915 kg), last menstrual period 03/15/2014, SpO2 97 %, unknown if currently breastfeeding.  Physical Exam:  General: alert and cooperative Lochia: appropriate Uterine Fundus: firm Incision: n/a DVT Evaluation: No evidence of DVT seen on physical exam. Lungs CTA  cv RRR  Recent Labs  03/02/15 1045 03/04/15 0617  HGB 10.6* 8.4*  HCT 32.9* 26.3*    Assessment/Plan: Plan for discharge tomorrow   LOS: 2 days   Laura Johnson 03/04/2015, 8:41 AM

## 2015-03-05 MED ORDER — DOCUSATE SODIUM 100 MG PO CAPS: 100.0000 mg | ORAL_CAPSULE | Freq: Two times a day (BID) | ORAL | Status: DC | PRN

## 2015-03-05 MED ORDER — IBUPROFEN 600 MG PO TABS: 600.0000 mg | ORAL_TABLET | Freq: Four times a day (QID) | ORAL | Status: DC

## 2015-03-05 NOTE — Anesthesia Postprocedure Evaluation (Signed)
  Anesthesia Post-op Note  Patient: Laura Johnson  Procedure(s) Performed: CLE  Anesthesia type:Regional, Epidural  Patient location: 345  Post pain: Pain level controlled  Post assessment: Post-op Vital signs reviewed, Patient's Cardiovascular Status Stable, Respiratory Function Stable, Patent Airway and No signs of Nausea or vomiting  Post vital signs: Reviewed and stable  Last Vitals:  Filed Vitals:   03/05/15 0724  BP: 120/76  Pulse: 73  Temp: 36.5 C  Resp: 18    Level of consciousness: awake, alert  and patient cooperative  Complications: No apparent anesthesia complications

## 2015-03-05 NOTE — Discharge Summary (Signed)
Obstetric Discharge Summary Reason for Admission: induction of labor for gestational HTN Prenatal Procedures: NST Intrapartum Procedures: spontaneous vaginal delivery Postpartum Procedures: Rubella Ig Complications-Operative and Postpartum: none HEMOGLOBIN  Date Value Ref Range Status  03/04/2015 8.4* 12.0 - 16.0 g/dL Final   HGB  Date Value Ref Range Status  07/07/2014 13.5 12.0-16.0 g/dL Final   HCT  Date Value Ref Range Status  03/04/2015 26.3* 35.0 - 47.0 % Final  07/07/2014 40.3 35.0-47.0 % Final    Physical Exam:  General: alert, cooperative and no distress. No headache. Lochia: appropriate Uterine Fundus: firm DVT Evaluation: No evidence of DVT seen on physical exam.  Discharge Diagnoses: Term Pregnancy-delivered and Preelampsia without severe features  Discharge Information: Date: 03/05/2015 Activity: pelvic rest Diet: routine Medications: PNV and Ibuprofen Condition: stable Instructions: refer to practice specific booklet Discharge to: home Follow-up Information    Follow up with Sharee Pimple, CNM In 6 weeks.   Specialty:  Obstetrics and Gynecology   Why:  For postpartum visit   Contact information:   [address] [place] [address] [phone]       Newborn Data: Live born female, "Claudine Mouton" Birth Weight: 7 lb 15 oz (3600 g) APGAR: 7, 9  Home with mother.  [name] 03/05/2015, 12:56 PM

## 2015-03-05 NOTE — Progress Notes (Signed)
D/C order from MD.  Reviewed d/c instructions and prescriptions with patient and answered any questions.  Patient d/c home with infant via wheelchair by nursing/auxillary. 

## 2015-03-05 NOTE — Discharge Instructions (Signed)
Care After Vaginal Delivery °Congratulations on your new baby!! ° °Refer to this sheet in the next few weeks. These discharge instructions provide you with information on caring for yourself after delivery. Your caregiver may also give you specific instructions. Your treatment has been planned according to the most current medical practices available, but problems sometimes occur. Call your caregiver if you have any problems or questions after you go home. ° °HOME CARE INSTRUCTIONS °· Take over-the-counter or prescription medicines only as directed by your caregiver or pharmacist. °· Do not drink alcohol, especially if you are breastfeeding or taking medicine to relieve pain. °· Do not chew or smoke tobacco. °· Do not use illegal drugs. °· Continue to use good perineal care. Good perineal care includes: °¨ Wiping your perineum from front to back. °¨ Keeping your perineum clean. °· Do not use tampons or douche until your caregiver says it is okay. °· Shower, wash your hair, and take tub baths as directed by your caregiver. °· Wear a well-fitting bra that provides breast support. °· Eat healthy foods. °· Drink enough fluids to keep your urine clear or pale yellow. °· Eat high-fiber foods such as whole grain cereals and breads, brown rice, beans, and fresh fruits and vegetables every day. These foods may help prevent or relieve constipation. °· Follow your caregiver's recommendations regarding resumption of activities such as climbing stairs, driving, lifting, exercising, or traveling. Specifically, no driving for two weeks, so that you are comfortable reacting quickly in an emergency. °· Talk to your caregiver about resuming sexual activities. Resumption of sexual activities is dependent upon your risk of infection, your rate of healing, and your comfort and desire to resume sexual activity. Usually we recommend waiting about six weeks, or until your bleeding stops and you are interested in sex. °· Try to have someone  help you with your household activities and your newborn for at least a few days after you leave the hospital. Even longer is better. °· Rest as much as possible. Try to rest or take a nap when your newborn is sleeping. Sleep deprivation can be very hard after delivery. °· Increase your activities gradually. °· Keep all of your scheduled postpartum appointments. It is very important to keep your scheduled follow-up appointments. At these appointments, your caregiver will be checking to make sure that you are healing physically and emotionally. ° °SEEK MEDICAL CARE IF:  °· You are passing large clots from your vagina.  °· You have a foul smelling discharge from your vagina. °· You have trouble urinating. °· You are urinating frequently. °· You have pain when you urinate. °· You have a change in your bowel movements. °· You have increasing redness, pain, or swelling near your vaginal incision (episiotomy) or vaginal tear. °· You have pus draining from your episiotomy or vaginal tear. °· Your episiotomy or vaginal tear is separating. °· You have painful, hard, or reddened breasts. °· You have a severe headache. °· You have blurred vision or see spots. °· You feel sad or depressed. °· You have thoughts of hurting yourself or your newborn. °· You have questions about your care, the care of your newborn, or medicines. °· You are dizzy or light-headed. °· You have a rash. °· You have nausea or vomiting. °· You were breastfeeding and have not had a menstrual period within 12 weeks after you stopped breastfeeding. °· You are not breastfeeding and have not had a menstrual period by the 12th week after delivery. °· You   have a fever. ° °SEEK IMMEDIATE MEDICAL CARE IF:  °· You have persistent pain. °· You have chest pain. °· You have shortness of breath. °· You faint. °· You have leg pain. °· You have stomach pain. °· Your vaginal bleeding saturates two or more sanitary pads in 1 hour. ° °MAKE SURE YOU:  °· Understand these  instructions. °· Will get help right away if you are not doing well or get worse. °·  °Document Released: 06/24/2000 Document Revised: 11/11/2013 Document Reviewed: 02/22/2012 ° °[employer]® Patient Information ©2015 [employer]. This information is not intended to replace advice given to you by your health care provider. Make sure you discuss any questions you have with your health care provider. ° °

## 2015-03-06 ENCOUNTER — Inpatient Hospital Stay: Admission: AD | Admit: 2015-03-06 | Payer: Medicaid Other

## 2016-09-02 DIAGNOSIS — H1045 Other chronic allergic conjunctivitis: Secondary | ICD-10-CM | POA: Diagnosis not present

## 2016-11-25 ENCOUNTER — Encounter: Payer: Self-pay | Admitting: Family Medicine

## 2016-11-25 ENCOUNTER — Ambulatory Visit (INDEPENDENT_AMBULATORY_CARE_PROVIDER_SITE_OTHER): Payer: BLUE CROSS/BLUE SHIELD | Admitting: Family Medicine

## 2016-11-25 ENCOUNTER — Other Ambulatory Visit (HOSPITAL_COMMUNITY)
Admission: RE | Admit: 2016-11-25 | Discharge: 2016-11-25 | Disposition: A | Discharge status: Home / Self Care | Payer: BLUE CROSS/BLUE SHIELD | Source: Ambulatory Visit | Attending: Family Medicine | Admitting: Family Medicine

## 2016-11-25 VITALS — BP 106/60 | HR 106 | Temp 98.5°F | Ht 66.0 in | Wt 123.4 lb

## 2016-11-25 DIAGNOSIS — N898 Other specified noninflammatory disorders of vagina: Secondary | ICD-10-CM | POA: Diagnosis not present

## 2016-11-25 DIAGNOSIS — N939 Abnormal uterine and vaginal bleeding, unspecified: Secondary | ICD-10-CM | POA: Diagnosis not present

## 2016-11-25 DIAGNOSIS — N921 Excessive and frequent menstruation with irregular cycle: Secondary | ICD-10-CM | POA: Diagnosis not present

## 2016-11-25 DIAGNOSIS — R300 Dysuria: Secondary | ICD-10-CM | POA: Insufficient documentation

## 2016-11-25 LAB — CBC
HCT: 42.1 % (ref 36.0–46.0)
Hemoglobin: 13.9 g/dL (ref 12.0–15.0)
MCHC: 33 g/dL (ref 30.0–36.0)
MCV: 86.2 fl (ref 78.0–100.0)
PLATELETS: 315 10*3/uL (ref 150.0–400.0)
RBC: 4.89 Mil/uL (ref 3.87–5.11)
RDW: 13.3 % (ref 11.5–15.5)
WBC: 10.5 10*3/uL (ref 4.0–10.5)

## 2016-11-25 LAB — URINALYSIS, MICROSCOPIC ONLY: Bacteria, UA: NONE SEEN

## 2016-11-25 LAB — POCT URINALYSIS DIPSTICK
Bilirubin, UA: NEGATIVE
Blood, UA: POSITIVE
Glucose, UA: NEGATIVE
Ketones, UA: NEGATIVE
LEUKOCYTES UA: NEGATIVE
NITRITE UA: NEGATIVE
PROTEIN UA: POSITIVE
UROBILINOGEN UA: 0.2 U/dL
pH, UA: 6 (ref 5.0–8.0)

## 2016-11-25 LAB — POCT URINE PREGNANCY: PREG TEST UR: NEGATIVE

## 2016-11-25 MED ORDER — CEPHALEXIN 500 MG PO CAPS: 500.0000 mg | ORAL_CAPSULE | Freq: Two times a day (BID) | ORAL | 0 refills | Status: DC

## 2016-11-25 NOTE — Patient Instructions (Addendum)
Nice to meet you. We will treat you for a UTI with Keflex. We will send your urine for culture. I would refrain from sexual activity until we have your vaginal swabs resulted. If you are sexually active please use a condom. If you start bleeding heavily again you may try ibuprofen 600 mg every 8 hours for 5 days. If you are bleeding excessively or you start to feel lightheaded or have palpitations or shortness of breath please seek medical attention immediately.

## 2016-11-25 NOTE — Assessment & Plan Note (Signed)
Symptoms consistent with UTI. Urinalysis with blood and protein though no leukocytes or nitrites. We'll proceed with treatment for UTI and we will send urine for culture and microscopy. Keflex sent to pharmacy.

## 2016-11-25 NOTE — Progress Notes (Signed)
  Laura AlarEric Sonnenberg, MD Phone: [phone]  Learta CoddingSamantha Johnson is a 21 y.o. female who presents today for f/u.  UTI: Patient notes urine started out smelling like celery. Then she developed urgency, frequency, and dysuria. No abdominal pain. No vaginal discharge.  Patient notes she had Nexplanon placed about a year and half ago. She has had intermittent vaginal bleeding since then. About every 7 days she'll bleed for about 8 days. Uses tampons and pads when this occurs. No palpitations or shortness of breath though does note some lightheadedness. No some cramps. Has spoken with her gynecologist office and they advised that they would just take Nexplanon out. Bleeding stopped yesterday though had a minimal amount of bleeding earlier today.  PMH: former smoker   ROS see history of present illness  Objective  Physical Exam Vitals:   11/25/16 1359  BP: 106/60  Pulse: (!) 106  Temp: 98.5 F (36.9 C)    BP Readings from Last 3 Encounters:  11/25/16 106/60  03/05/15 120/76  12/18/14 112/71   Wt Readings from Last 3 Encounters:  11/25/16 123 lb 6.4 oz (56 kg)  03/02/15 185 lb (83.9 kg) (95 %, Z= 1.69)*  12/18/14 157 lb (71.2 kg) (85 %, Z= 1.06)*   * Growth percentiles are based on CDC 2-20 Years data.    Physical Exam  Constitutional: No distress.  HENT:  Head: Normocephalic and atraumatic.  Cardiovascular: Normal rate, regular rhythm and normal heart sounds.   Pulmonary/Chest: Effort normal and breath sounds normal.  Abdominal: Soft. Bowel sounds are normal. She exhibits no distension. There is tenderness (minimal suprapubic). There is no rebound and no guarding.  Genitourinary:  Genitourinary Comments: Normal labia, normal vaginal mucosa, white discharge noted, no cervical irritation or bleeding noted, discomfort on insertion of fingers in to vagina on bimanual exam though no cervical motion tenderness or adnexal tenderness  Neurological: She is alert. Gait normal.  Skin: Skin  is warm and dry. She is not diaphoretic.     Assessment/Plan: Please see individual problem list.  Menorrhagia with irregular cycle Likely related to her Nexplanon. Urine pregnancy test is negative. Discussed that she would benefit from removal of the Nexplanon given long history of bleeding with the Nexplanon. She has previously discussed this with her gynecologist and is unsure if she wants to do this. Advised that she needs to see her gynecologist to consider removal of the Nexplanon. Advised on ibuprofen use if bleeding becomes heavy again. We'll check a CBC. If she becomes symptomatic she'll be evaluated. She's given return precautions.  Dysuria Symptoms consistent with UTI. Urinalysis with blood and protein though no leukocytes or nitrites. We'll proceed with treatment for UTI and we will send urine for culture and microscopy. Keflex sent to pharmacy.   Orders Placed This Encounter  Procedures  . Urine Culture  . CBC  . Urine Microscopic Only  . POCT Urinalysis Dipstick  . POCT urine pregnancy    Laura AlarEric Sonnenberg, MD [facility] [place]

## 2016-11-25 NOTE — Assessment & Plan Note (Addendum)
Likely related to her Nexplanon. Urine pregnancy test is negative. Discussed that she would benefit from removal of the Nexplanon given long history of bleeding with the Nexplanon. She has previously discussed this with her gynecologist and is unsure if she wants to do this. Advised that she needs to see her gynecologist to consider removal of the Nexplanon. Advised on ibuprofen use if bleeding becomes heavy again. We'll check a CBC. If she becomes symptomatic she'll be evaluated. She's given return precautions.

## 2016-11-26 LAB — URINE CULTURE: ORGANISM ID, BACTERIA: NO GROWTH

## 2016-11-27 ENCOUNTER — Encounter: Payer: Self-pay | Admitting: Family Medicine

## 2016-11-28 ENCOUNTER — Other Ambulatory Visit: Payer: Self-pay | Admitting: Family Medicine

## 2016-11-28 LAB — CERVICOVAGINAL ANCILLARY ONLY
CHLAMYDIA, DNA PROBE: NEGATIVE
NEISSERIA GONORRHEA: NEGATIVE
Wet Prep (BD Affirm): POSITIVE — AB

## 2016-11-28 MED ORDER — METRONIDAZOLE 500 MG PO TABS: 500.0000 mg | ORAL_TABLET | Freq: Two times a day (BID) | ORAL | 0 refills | Status: DC

## 2016-11-29 NOTE — Telephone Encounter (Signed)
Pt called requesting the rest of her results. Pt would also like them to be sent to her mychart. Please advise, thank you!  Call pt @ [phone]

## 2016-12-23 DIAGNOSIS — Z973 Presence of spectacles and contact lenses: Secondary | ICD-10-CM | POA: Diagnosis not present

## 2017-04-21 DIAGNOSIS — Z01419 Encounter for gynecological examination (general) (routine) without abnormal findings: Secondary | ICD-10-CM | POA: Diagnosis not present

## 2017-04-21 DIAGNOSIS — Z124 Encounter for screening for malignant neoplasm of cervix: Secondary | ICD-10-CM | POA: Diagnosis not present

## 2017-04-21 DIAGNOSIS — Z113 Encounter for screening for infections with a predominantly sexual mode of transmission: Secondary | ICD-10-CM | POA: Diagnosis not present

## 2017-09-22 ENCOUNTER — Other Ambulatory Visit: Payer: Self-pay | Admitting: Obstetrics and Gynecology

## 2017-09-22 DIAGNOSIS — O09299 Supervision of pregnancy with other poor reproductive or obstetric history, unspecified trimester: Secondary | ICD-10-CM | POA: Diagnosis not present

## 2017-09-22 DIAGNOSIS — Z3481 Encounter for supervision of other normal pregnancy, first trimester: Secondary | ICD-10-CM | POA: Diagnosis not present

## 2017-09-22 DIAGNOSIS — Z369 Encounter for antenatal screening, unspecified: Secondary | ICD-10-CM

## 2017-09-22 LAB — OB RESULTS CONSOLE RUBELLA ANTIBODY, IGM: RUBELLA: IMMUNE

## 2017-09-22 LAB — OB RESULTS CONSOLE HIV ANTIBODY (ROUTINE TESTING): HIV: NONREACTIVE

## 2017-09-22 LAB — OB RESULTS CONSOLE HEPATITIS B SURFACE ANTIGEN: Hepatitis B Surface Ag: NEGATIVE

## 2017-09-22 LAB — OB RESULTS CONSOLE VARICELLA ZOSTER ANTIBODY, IGG: VARICELLA IGG: IMMUNE

## 2017-10-16 ENCOUNTER — Ambulatory Visit: Payer: BLUE CROSS/BLUE SHIELD

## 2017-10-19 ENCOUNTER — Ambulatory Visit
Admission: RE | Admit: 2017-10-19 | Discharge: 2017-10-19 | Disposition: A | Discharge status: Home / Self Care | Payer: BLUE CROSS/BLUE SHIELD | Source: Ambulatory Visit | Attending: Maternal & Fetal Medicine | Admitting: Maternal & Fetal Medicine

## 2017-10-19 ENCOUNTER — Ambulatory Visit (HOSPITAL_BASED_OUTPATIENT_CLINIC_OR_DEPARTMENT_OTHER)
Admission: RE | Admit: 2017-10-19 | Discharge: 2017-10-19 | Disposition: A | Discharge status: Home / Self Care | Payer: BLUE CROSS/BLUE SHIELD | Source: Ambulatory Visit | Attending: Maternal & Fetal Medicine | Admitting: Maternal & Fetal Medicine

## 2017-10-19 ENCOUNTER — Encounter: Payer: Self-pay | Admitting: *Deleted

## 2017-10-19 VITALS — BP 127/79 | HR 78 | Temp 98.3°F | Resp 18 | Wt 135.8 lb

## 2017-10-19 DIAGNOSIS — Z36 Encounter for antenatal screening for chromosomal anomalies: Secondary | ICD-10-CM | POA: Diagnosis not present

## 2017-10-19 DIAGNOSIS — Z579 Occupational exposure to unspecified risk factor: Secondary | ICD-10-CM

## 2017-10-19 DIAGNOSIS — Z369 Encounter for antenatal screening, unspecified: Secondary | ICD-10-CM | POA: Diagnosis not present

## 2017-10-19 DIAGNOSIS — Z3A13 13 weeks gestation of pregnancy: Secondary | ICD-10-CM | POA: Insufficient documentation

## 2017-10-19 DIAGNOSIS — Z3682 Encounter for antenatal screening for nuchal translucency: Secondary | ICD-10-CM | POA: Insufficient documentation

## 2017-10-19 DIAGNOSIS — Z3491 Encounter for supervision of normal pregnancy, unspecified, first trimester: Secondary | ICD-10-CM | POA: Diagnosis not present

## 2017-10-19 NOTE — Progress Notes (Signed)
Referring physician:  Heloise Ochoa Length of Consultation: 40 minutes   Laura Johnson  was referred to [facility] for genetic counseling to review prenatal screening and testing options.  She also had questions regarding workplace exposures as they may relate to this pregnancy. This note summarizes the information we discussed.    We offered the following routine screening tests for this pregnancy:  First trimester screening, which includes nuchal translucency ultrasound screen and first trimester maternal serum marker screening.  The nuchal translucency has approximately an 80% detection rate for Down syndrome and can be positive for other chromosome abnormalities as well as congenital heart defects.  When combined with a maternal serum marker screening, the detection rate is up to 90% for Down syndrome and up to 97% for trisomy 18.     Maternal serum marker screening, a blood test that measures pregnancy proteins, can provide risk assessments for Down syndrome, trisomy 18, and open neural tube defects (spina bifida, anencephaly). Because it does not directly examine the fetus, it cannot positively diagnose or rule out these problems.  Targeted ultrasound uses high frequency sound waves to create an image of the developing fetus.  An ultrasound is often recommended as a routine means of evaluating the pregnancy.  It is also used to screen for fetal anatomy problems (for example, a heart defect) that might be suggestive of a chromosomal or other abnormality.   Should these screening tests indicate an increased concern, then the following additional testing options would be offered:  The chorionic villus sampling procedure is available for first trimester chromosome analysis.  This involves the withdrawal of a small amount of chorionic villi (tissue from the developing placenta).  Risk of pregnancy loss is estimated to be approximately 1 in 200 to 1 in 100 (0.5 to 1%).   There is approximately a 1% (1 in 100) chance that the CVS chromosome results will be unclear.  Chorionic villi cannot be tested for neural tube defects.     Amniocentesis involves the removal of a small amount of amniotic fluid from the sac surrounding the fetus with the use of a thin needle inserted through the maternal abdomen and uterus.  Ultrasound guidance is used throughout the procedure.  Fetal cells from amniotic fluid are directly evaluated and > 99.5% of chromosome problems and > 98% of open neural tube defects can be detected. This procedure is generally performed after the 15th week of pregnancy.  The main risks to this procedure include complications leading to miscarriage in less than 1 in 200 cases (0.5%).  As another option for information if the pregnancy is suspected to be an an increased chance for certain chromosome conditions, we also reviewed the availability of cell free fetal DNA testing from maternal blood to determine whether or not the baby may have either Down syndrome, trisomy 96, or trisomy 69.  This test utilizes a maternal blood sample and DNA sequencing technology to isolate circulating cell free fetal DNA from maternal plasma.  The fetal DNA can then be analyzed for DNA sequences that are derived from the three most common chromosomes involved in aneuploidy, chromosomes 13, 18, and 21.  If the overall amount of DNA is greater than the expected level for any of these chromosomes, aneuploidy is suspected.  While we do not consider it a replacement for invasive testing and karyotype analysis, a negative result from this testing would be reassuring, though not a guarantee of a normal chromosome complement for the baby.  An abnormal result is certainly suggestive of an abnormal chromosome complement, though we would still recommend CVS or amniocentesis to confirm any findings from this testing.  Cystic Fibrosis and Spinal Muscular Atrophy (SMA) screening were also discussed with  the patient. Both conditions are recessive, which means that both parents must be carriers in order to have a child with the disease.  Cystic fibrosis (CF) is one of the most common genetic conditions in persons of Caucasian ancestry.  This condition occurs in approximately 1 in 2,500 Caucasian persons and results in thickened secretions in the lungs, digestive, and reproductive systems.  For a baby to be at risk for having CF, both of the parents must be carriers for this condition.  Approximately 1 in 87 Caucasian persons is a carrier for CF.  Current carrier testing looks for the most common mutations in the gene for CF and can detect approximately 90% of carriers in the Caucasian population.  This means that the carrier screening can greatly reduce, but cannot eliminate, the chance for an individual to have a child with CF.  If an individual is found to be a carrier for CF, then carrier testing would be available for the partner. As part of [place]'s newborn screening profile, all babies born in the state of [place] will have a two-tier screening process.  Specimens are first tested to determine the concentration of immunoreactive trypsinogen (IRT).  The top 5% of specimens with the highest IRT values then undergo DNA testing using a panel of over 40 common CF mutations. SMA is a neurodegenerative disorder that leads to atrophy of skeletal muscle and overall weakness.  This condition is also more prevalent in the Caucasian population, with 1 in 40-1 in 60 persons being a carrier and 1 in 6,000-1 in 10,000 children being affected.  There are multiple forms of the disease, with some causing death in infancy to other forms with survival into adulthood.  The genetics of SMA is complex, but carrier screening can detect up to 95% of carriers in the Caucasian population.  Similar to CF, a negative result can greatly reduce, but cannot eliminate, the chance to have a child with SMA.  We obtained a  detailed family history and pregnancy history. The patient reported that their daughter has febrile seizures, she herself had seizures in her sleep as a child (but has not been on medication or had any seizures since age 89), and her mother has seizures of unknown etiology.  None of them have neurocutaneous markers, other health concerns or developmental differences to suggest a genetic syndrome associated with the seizures. Given the history, there may be an increased risk for this baby to have seizures and that chance may be as high as 50% if there were some dominant inherited cause.  However, because no underlying cause has been clearly determined, it is difficult to further quantify the chance. The remainder of the family history was reported to be unremarkable for birth defects, intellectual delays, recurrent pregnancy loss or known chromosome abnormalities.  Laura Johnson stated that this is the second pregnancy for she and her husband.  They have a healthy 2 year old daughter.  She reported no complications in this pregnancy other than spotting at [redacted] weeks gestation.  She works at a [employer] and expressed concern about being present around x-rays and nitrous oxide.  When the nitrous oxide has been used, she has stayed in a different part of the building and states that this is  used approximately 1 time per week.  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  When x-rays are taken, she usually is behind a wall, but there have been a few occassions during which she was in the room when the x-rays were taken for a patient.  Available data would suggest that a large number of x-rays would be needed to have a teratogenic risk to a pregnancy.  We would encourage a radiation safety  badge to quantify the amount of exposure if this is something that will continue.  If she must be in the room, we would also recommend shielding for extra protection.  With occasional workplace exposure, it is thought to be unlikely that there would be an increased risk for birth defects in this pregnancy.  However, since we cannot quantify the amount of exposure in this case, we cannot completely rule out any increased concern for the fetus. We reviewed that there have been studies that have associated prenatal radiation exposure with increased risks for miscarriage, childhood cancers, fetal growth restriction, microcephaly and developmental concerns. These concerns vary greatly based upon the timing in pregnancy and the dosage of the exposure, but were most clearly associated with large doses such as those seen during the nuclear bombs.   An ultrasound was performed at the time of the visit.  The gestational age was consistent with 13 weeks.  Fetal anatomy could not be assessed due to early gestational age.  Please refer to the ultrasound report for details of that study.  After consideration, Laura Johnson elected to proceed with an ultrasound and first trimester screening.  She declined CF and SMA carrier screening.  Laura Johnson was encouraged to call with questions or concerns.  We can be contacted at [phone].  Labs ordered:  First trimester screening  Laura Andersoneborah F. Onix Jumper, MS, CGC

## 2017-10-23 ENCOUNTER — Telehealth: Payer: Self-pay | Admitting: Obstetrics and Gynecology

## 2017-10-23 NOTE — Telephone Encounter (Signed)
  Ms. Laura Johnson elected to undergo First Trimester screening on 10/19/2017.  To review, first trimester screening, includes nuchal translucency ultrasound screen and/or first trimester maternal serum marker screening.  The nuchal translucency has approximately an 80% detection rate for Down syndrome and can be positive for other chromosome abnormalities as well as heart defects.  When combined with a maternal serum marker screening, the detection rate is up to 90% for Down syndrome and up to 97% for trisomy 13 and 18.     The results of the First Trimester Nuchal Translucency and Biochemical Screening were within normal range.  The risk for Down syndrome is now estimated to be less than 1 in 10,000.  The risk for Trisomy 13/18 is also estimated to be less than 1 in 10,000.  Should more definitive information be desired, we would offer amniocentesis.  Because we do not yet know the effectiveness of combined first and second trimester screening, we do not recommend a maternal serum screen to assess the chance for chromosome conditions.  However, if screening for neural tube defects is desired, maternal serum screening for AFP only can be performed between 15 and [redacted] weeks gestation.     Laura Andersoneborah F. Amron Guerrette, MS, CGC

## 2017-12-07 NOTE — Progress Notes (Signed)
Pt seen by me, agree with assessment and plan as outlined in CGC Wells's note 

## 2018-02-05 LAB — OB RESULTS CONSOLE GBS: GBS: POSITIVE

## 2018-03-30 LAB — OB RESULTS CONSOLE GC/CHLAMYDIA
CHLAMYDIA, DNA PROBE: NEGATIVE
Gonorrhea: NEGATIVE

## 2018-03-30 LAB — OB RESULTS CONSOLE RPR: RPR: NONREACTIVE

## 2018-04-19 ENCOUNTER — Other Ambulatory Visit: Payer: Self-pay

## 2018-04-19 ENCOUNTER — Inpatient Hospital Stay
Admission: EM | Admit: 2018-04-19 | Discharge: 2018-04-21 | DRG: 805 | Disposition: A | Discharge status: Home / Self Care | Payer: Medicaid Other | Source: Ambulatory Visit | Attending: Obstetrics and Gynecology | Admitting: Obstetrics and Gynecology

## 2018-04-19 DIAGNOSIS — O9832 Other infections with a predominantly sexual mode of transmission complicating childbirth: Secondary | ICD-10-CM | POA: Diagnosis not present

## 2018-04-19 DIAGNOSIS — Z87891 Personal history of nicotine dependence: Secondary | ICD-10-CM

## 2018-04-19 DIAGNOSIS — F329 Major depressive disorder, single episode, unspecified: Secondary | ICD-10-CM | POA: Diagnosis present

## 2018-04-19 DIAGNOSIS — Z3A38 38 weeks gestation of pregnancy: Secondary | ICD-10-CM | POA: Diagnosis not present

## 2018-04-19 DIAGNOSIS — O99344 Other mental disorders complicating childbirth: Secondary | ICD-10-CM | POA: Diagnosis not present

## 2018-04-19 DIAGNOSIS — F419 Anxiety disorder, unspecified: Secondary | ICD-10-CM | POA: Diagnosis present

## 2018-04-19 DIAGNOSIS — O368130 Decreased fetal movements, third trimester, not applicable or unspecified: Secondary | ICD-10-CM | POA: Diagnosis not present

## 2018-04-19 DIAGNOSIS — A6000 Herpesviral infection of urogenital system, unspecified: Secondary | ICD-10-CM | POA: Diagnosis not present

## 2018-04-19 DIAGNOSIS — O368190 Decreased fetal movements, unspecified trimester, not applicable or unspecified: Secondary | ICD-10-CM | POA: Diagnosis present

## 2018-04-19 DIAGNOSIS — I493 Ventricular premature depolarization: Secondary | ICD-10-CM | POA: Diagnosis not present

## 2018-04-19 DIAGNOSIS — Z3A39 39 weeks gestation of pregnancy: Secondary | ICD-10-CM | POA: Diagnosis not present

## 2018-04-19 DIAGNOSIS — O9942 Diseases of the circulatory system complicating childbirth: Secondary | ICD-10-CM | POA: Diagnosis not present

## 2018-04-19 LAB — CBC
HCT: 35.2 % — ABNORMAL LOW (ref 36.0–46.0)
Hemoglobin: 12.4 g/dL (ref 12.0–15.0)
MCH: 32.2 pg (ref 26.0–34.0)
MCHC: 35.2 g/dL (ref 30.0–36.0)
MCV: 91.4 fL (ref 80.0–100.0)
Platelets: 218 10*3/uL (ref 150–400)
RBC: 3.85 MIL/uL — ABNORMAL LOW (ref 3.87–5.11)
RDW: 13.2 % (ref 11.5–15.5)
WBC: 13.1 10*3/uL — ABNORMAL HIGH (ref 4.0–10.5)
nRBC: 0 % (ref 0.0–0.2)

## 2018-04-19 LAB — TYPE AND SCREEN
ABO/RH(D): O POS
Antibody Screen: NEGATIVE

## 2018-04-19 MED ORDER — OXYTOCIN BOLUS FROM INFUSION
500.0000 mL | Freq: Once | INTRAVENOUS | Status: AC
  Administered 2018-04-20: 500 mL via INTRAVENOUS

## 2018-04-19 MED ORDER — SODIUM CHLORIDE 0.9 % IV SOLN
1.0000 g | INTRAVENOUS | Status: DC
  Administered 2018-04-20 (×2): 1 g via INTRAVENOUS
  Filled 2018-04-19 (×6): qty 1000

## 2018-04-19 MED ORDER — LACTATED RINGERS IV SOLN: 500.0000 mL | INTRAVENOUS | Status: DC | PRN

## 2018-04-19 MED ORDER — ACETAMINOPHEN 325 MG PO TABS: 650.0000 mg | ORAL_TABLET | ORAL | Status: DC | PRN

## 2018-04-19 MED ORDER — ONDANSETRON HCL 4 MG/2ML IJ SOLN
4.0000 mg | Freq: Four times a day (QID) | INTRAMUSCULAR | Status: DC | PRN
  Administered 2018-04-20: 4 mg via INTRAVENOUS
  Filled 2018-04-19: qty 2

## 2018-04-19 MED ORDER — TERBUTALINE SULFATE 1 MG/ML IJ SOLN
0.2500 mg | Freq: Once | INTRAMUSCULAR | Status: DC | PRN
  Filled 2018-04-19: qty 1

## 2018-04-19 MED ORDER — SODIUM CHLORIDE 0.9 % IV SOLN
2.0000 g | Freq: Once | INTRAVENOUS | Status: AC
  Administered 2018-04-19: 2 g via INTRAVENOUS
  Filled 2018-04-19: qty 2000

## 2018-04-19 MED ORDER — LIDOCAINE HCL (PF) 1 % IJ SOLN
30.0000 mL | INTRAMUSCULAR | Status: DC | PRN
  Filled 2018-04-19: qty 30

## 2018-04-19 MED ORDER — SOD CITRATE-CITRIC ACID 500-334 MG/5ML PO SOLN: 30.0000 mL | ORAL | Status: DC | PRN

## 2018-04-19 MED ORDER — FENTANYL CITRATE (PF) 100 MCG/2ML IJ SOLN
50.0000 ug | INTRAMUSCULAR | Status: DC | PRN
  Administered 2018-04-20: 100 ug via INTRAVENOUS
  Filled 2018-04-19: qty 2

## 2018-04-19 MED ORDER — LACTATED RINGERS IV SOLN
INTRAVENOUS | Status: DC
  Administered 2018-04-19 – 2018-04-20 (×2): via INTRAVENOUS

## 2018-04-19 MED ORDER — OXYTOCIN 40 UNITS IN LACTATED RINGERS INFUSION - SIMPLE MED
2.5000 [IU]/h | INTRAVENOUS | Status: DC
  Administered 2018-04-20: 2.5 [IU]/h via INTRAVENOUS
  Filled 2018-04-19: qty 1000

## 2018-04-19 MED ORDER — OXYTOCIN 40 UNITS IN LACTATED RINGERS INFUSION - SIMPLE MED
1.0000 m[IU]/min | INTRAVENOUS | Status: DC
  Administered 2018-04-19: 1.333 m[IU]/min via INTRAVENOUS

## 2018-04-19 NOTE — H&P (Signed)
OB History & Physical   History of Present Illness:  Chief Complaint: here for decreased fetal movement  HPI:  Gazella Anglin is a 22 y.o. G2P1001 female at [redacted]w[redacted]d dated by [place] at [redacted]w[redacted]d.  She presents to L&D for audible decel heard in office today to 105bpm, with c/o decreased fetal movement for several days. She has had to "poke at baby and drink caffeine" to get the baby moving.    Pregnancy Issues: 1. Hx Pre-eclampsia- pt dc baby ASA, baseline labs WNL.  2. Hx anxiety/depression: previously on Effexor 37.5mg  daily- not during preg.  3. Hx HSV- has had several outbreaks, started suppression 28wks 4. GBS bacteriuria 02/07/18 5. PVCs: f/u with cardiology. S/p Holter monitor, cut out caffeine but is still happening.   Maternal Medical History:   Past Medical History:  Diagnosis Date  . ADHD (attention deficit hyperactivity disorder)   . Depression   . History of chicken pox   . Seizures (HCC)    elementary school  . Shingles    right trunk    Past Surgical History:  Procedure Laterality Date  . TONSILLECTOMY    . TONSILLECTOMY AND ADENOIDECTOMY  2014   Dr. Chestine Spore    No Known Allergies  Prior to Admission medications   Medication Sig Start Date End Date Taking? Authorizing Provider  acyclovir (ZOVIRAX) 800 MG tablet Take 1 tablet (800 mg total) by mouth 2 (two) times daily. 03/20/14  Yes Shelia Media, MD  Prenatal Vit-Fe Fumarate-FA (PRENATAL MULTIVITAMIN) TABS tablet Take 1 tablet by mouth daily at 12 noon.   Yes [provider]  amoxicillin (AMOXIL) 875 MG tablet TAKE 1 TABLET BY MOUTH EVERY 12 HOURS FOR 7 DAYS 11/21/16   [provider]  chlorpheniramine-HYDROcodone (TUSSIONEX) 10-8 MG/5ML SUER TAKE 1 TEASPOONFUL ( ) BY MOUTH TWICE A DAY AS NEEDED FOR COUGH 11/22/16   [provider]  docusate sodium (COLACE) 100 MG capsule Take 1 capsule (100 mg total) by mouth 2 (two) times daily as needed. Patient not taking: Reported on 10/19/2017  03/05/15   [name], MD  erythromycin ophthalmic ointment APPLY 1 CM RIBBON INTO THE LOWER CONJUNCTIVAL SAC IN BOTH EYES BY OPHTHALMIC ROUTE 4 TIMES PER DAY 11/22/16   [provider]  Etonogestrel (NEXPLANON [place]) Inject into the skin.    [provider]  ibuprofen (ADVIL,MOTRIN) 600 MG tablet Take 1 tablet (600 mg total) by mouth every 6 (six) hours. Patient not taking: Reported on 10/19/2017 03/05/15   [name], MD  metroNIDAZOLE (FLAGYL) 500 MG tablet Take 1 tablet (500 mg total) by mouth 2 (two) times daily. Patient not taking: Reported on 10/19/2017 11/28/16   Glori Luis, MD  venlafaxine XR (EFFEXOR-XR) 37.5 MG 24 hr capsule Take 37.5 mg by mouth daily. 10/25/16   [provider]     Prenatal care site: [facility]    Social History: She  reports that she has quit smoking. She has never used smokeless tobacco. She reports that she does not drink alcohol or use drugs.  Family History: family history includes Arthritis in her mother; Cancer in her maternal grandmother.   Review of Systems: A full review of systems was performed and negative except as noted in the HPI.     Physical Exam:  Vital Signs: BP 119/66 (BP Location: Left Arm)   Pulse 79   Temp 97.9 F (36.6 C) (Oral)   Resp 18   Ht 5\' 6"  (1.676 m)   Wt 75.3  kg   BMI 26.79 kg/m    General: no acute distress.  HEENT: normocephalic, atraumatic Heart: regular rate & rhythm.  No murmurs/rubs/gallops Lungs: clear to auscultation bilaterally, normal respiratory effort Abdomen: soft, gravid, non-tender;  EFW: 7lbs  Pelvic: SSE done, no lesions.   External: Normal external female genitalia, no HSV lesions  Cervix: 3-4/50/-2, soft, midposition   Extremities: non-tender, symmetric, no edema bilaterally.  DTRs: 2+  Neurologic: Alert & oriented x 3.    Results for orders placed or performed during the hospital encounter of 04/19/18 (from the past 24 hour(s))  CBC      Status: Abnormal   Collection Time: 04/19/18  7:38 PM  Result Value Ref Range   WBC 13.1 (H) 4.0 - 10.5 K/uL   RBC 3.85 (L) 3.87 - 5.11 MIL/uL   Hemoglobin 12.4 12.0 - 15.0 g/dL   HCT 16.1 (L) 09.6 - 04.5 %   MCV 91.4 80.0 - 100.0 fL   MCH 32.2 26.0 - 34.0 pg   MCHC 35.2 30.0 - 36.0 g/dL   RDW 40.9 81.1 - 91.4 %   Platelets 218 150 - 400 K/uL   nRBC 0.0 0.0 - 0.2 %  Type and screen [facility]     Status: None   Collection Time: 04/19/18  7:38 PM  Result Value Ref Range   ABO/RH(D) O POS    Antibody Screen NEG    Sample Expiration      04/22/2018 Performed at [facility], [address], [place], [place] [address]     Pertinent Results:  Prenatal Labs: Blood type/Rh  O Pos  Antibody screen neg  Rubella Immune  Varicella Immune  RPR NR  HBsAg Neg  HIV NR  GC neg  Chlamydia neg  Genetic screening negative  1 hour GTT  68  GBS  Pos urine 02/07/18   FHT: 150bpm, mod variability, no decels, + accels TOCO: occasional irritability SVE:  Done in office 3-4/50/-2, soft/midposition.    Cephalic by leopolds  No results found.  Assessment:  Zulma Court is a 22 y.o. G9P1001 female at [redacted]w[redacted]d with NR FHR in office and decreased fetal movement.   Plan:  1. Admit to Labor & Delivery; consents reviewed and obtained - discussed with Dr Elesa Massed, reviewed hx and CC, recommends induction of labor.   2. Fetal Well being  - Fetal Tracing: Cat I  - Group B Streptococcus ppx indicated: Pos. - Presentation: cephalic confirmed by exam and leopolds   3. Routine OB: - Prenatal labs reviewed, as above - Rh O Pos - CBC, T&S, RPR on admit - Clear fluids, IVF  4. Induction of Labor -  Contractions: external toco in place -  Pelvis proven to 7#15 -  Plan for induction with Pitocin and AROM -  Plan for continuous fetal monitoring  -  Maternal pain control as desired; discussed options of IVPM, nitrous, regional anesthesia - Anticipate vaginal  delivery  5. Post Partum Planning: - Infant feeding: Breast - Contraception: undecided  McVey, REBECCA A, CNM 04/19/18 9:27 PM

## 2018-04-19 NOTE — Plan of Care (Signed)
Pt aware of plan of care to start IV antibiotics for GBS+ results and start oxytocin induction. Pt in agreement to plan of care at this time. No needs expressed at this time. Will continue to monitor and assess patient.

## 2018-04-20 ENCOUNTER — Inpatient Hospital Stay: Payer: Medicaid Other | Admitting: Anesthesiology

## 2018-04-20 MED ORDER — EPHEDRINE 5 MG/ML INJ
10.0000 mg | INTRAVENOUS | Status: DC | PRN
  Filled 2018-04-20: qty 2

## 2018-04-20 MED ORDER — ACETAMINOPHEN 325 MG PO TABS
650.0000 mg | ORAL_TABLET | ORAL | Status: DC | PRN
  Administered 2018-04-20: 650 mg via ORAL
  Filled 2018-04-20: qty 2

## 2018-04-20 MED ORDER — PRENATAL MULTIVITAMIN CH
1.0000 | ORAL_TABLET | Freq: Every day | ORAL | Status: DC
  Administered 2018-04-20: 1 via ORAL
  Filled 2018-04-20: qty 1

## 2018-04-20 MED ORDER — SENNOSIDES-DOCUSATE SODIUM 8.6-50 MG PO TABS: 2.0000 | ORAL_TABLET | ORAL | Status: DC

## 2018-04-20 MED ORDER — IBUPROFEN 600 MG PO TABS
600.0000 mg | ORAL_TABLET | Freq: Four times a day (QID) | ORAL | Status: DC
  Administered 2018-04-20 (×2): 600 mg via ORAL
  Filled 2018-04-20: qty 1

## 2018-04-20 MED ORDER — LACTATED RINGERS IV SOLN
500.0000 mL | Freq: Once | INTRAVENOUS | Status: AC
  Administered 2018-04-20: 500 mL via INTRAVENOUS

## 2018-04-20 MED ORDER — BENZOCAINE-MENTHOL 20-0.5 % EX AERO: 1.0000 "application " | INHALATION_SPRAY | CUTANEOUS | Status: DC | PRN

## 2018-04-20 MED ORDER — PHENYLEPHRINE 40 MCG/ML (10ML) SYRINGE FOR IV PUSH (FOR BLOOD PRESSURE SUPPORT)
80.0000 ug | PREFILLED_SYRINGE | INTRAVENOUS | Status: DC | PRN
  Filled 2018-04-20: qty 5

## 2018-04-20 MED ORDER — DIPHENHYDRAMINE HCL 25 MG PO CAPS: 25.0000 mg | ORAL_CAPSULE | Freq: Four times a day (QID) | ORAL | Status: DC | PRN

## 2018-04-20 MED ORDER — INFLUENZA VAC SPLIT QUAD 0.5 ML IM SUSY: 0.5000 mL | PREFILLED_SYRINGE | INTRAMUSCULAR | Status: DC | PRN

## 2018-04-20 MED ORDER — DIPHENHYDRAMINE HCL 50 MG/ML IJ SOLN: 12.5000 mg | INTRAMUSCULAR | Status: DC | PRN

## 2018-04-20 MED ORDER — IBUPROFEN 600 MG PO TABS
600.0000 mg | ORAL_TABLET | Freq: Four times a day (QID) | ORAL | Status: DC
  Administered 2018-04-21 (×2): 600 mg via ORAL
  Filled 2018-04-20 (×2): qty 1

## 2018-04-20 MED ORDER — WITCH HAZEL-GLYCERIN EX PADS: 1.0000 "application " | MEDICATED_PAD | CUTANEOUS | Status: DC | PRN

## 2018-04-20 MED ORDER — ONDANSETRON HCL 4 MG PO TABS: 4.0000 mg | ORAL_TABLET | ORAL | Status: DC | PRN

## 2018-04-20 MED ORDER — SIMETHICONE 80 MG PO CHEW: 80.0000 mg | CHEWABLE_TABLET | ORAL | Status: DC | PRN

## 2018-04-20 MED ORDER — FENTANYL 2.5 MCG/ML W/ROPIVACAINE 0.15% IN NS 100 ML EPIDURAL (ARMC)
EPIDURAL | Status: AC
  Filled 2018-04-20: qty 100

## 2018-04-20 MED ORDER — SODIUM CHLORIDE 0.9 % IV SOLN
INTRAVENOUS | Status: DC | PRN
  Administered 2018-04-20 (×3): 5 mL via EPIDURAL

## 2018-04-20 MED ORDER — ZOLPIDEM TARTRATE 5 MG PO TABS: 5.0000 mg | ORAL_TABLET | Freq: Every evening | ORAL | Status: DC | PRN

## 2018-04-20 MED ORDER — DIBUCAINE 1 % RE OINT: 1.0000 "application " | TOPICAL_OINTMENT | RECTAL | Status: DC | PRN

## 2018-04-20 MED ORDER — ONDANSETRON HCL 4 MG/2ML IJ SOLN: 4.0000 mg | INTRAMUSCULAR | Status: DC | PRN

## 2018-04-20 MED ORDER — LIDOCAINE HCL (PF) 1 % IJ SOLN
INTRAMUSCULAR | Status: DC | PRN
  Administered 2018-04-20: 1 mL

## 2018-04-20 MED ORDER — FENTANYL 2.5 MCG/ML W/ROPIVACAINE 0.15% IN NS 100 ML EPIDURAL (ARMC)
12.0000 mL/h | EPIDURAL | Status: DC
  Administered 2018-04-20: 12 mL/h via EPIDURAL

## 2018-04-20 MED ORDER — COCONUT OIL OIL: 1.0000 "application " | TOPICAL_OIL | Status: DC | PRN

## 2018-04-20 NOTE — Anesthesia Procedure Notes (Signed)
Epidural Patient location during procedure: OB Start time: 04/20/2018 5:25 AM End time: 04/20/2018 5:46 AM  Staffing Anesthesiologist: Jovita Gamma, MD Performed: anesthesiologist   Preanesthetic Checklist Completed: patient identified, site marked, surgical consent, pre-op evaluation, timeout performed, IV checked, risks and benefits discussed and monitors and equipment checked  Epidural Patient position: sitting Prep: ChloraPrep Patient monitoring: heart rate, continuous pulse ox and blood pressure Approach: midline Location: L4-L5 Injection technique: LOR saline  Needle:  Needle type: Tuohy  Needle gauge: 18 G Needle length: 9 cm and 9 Needle insertion depth: 6 cm Catheter type: closed end flexible Catheter size: 20 Guage Catheter at skin depth: 10 cm Test dose: negative and Other  Assessment Events: blood not aspirated, injection not painful, no injection resistance, negative IV test and no paresthesia  Additional Notes   Patient tolerated the insertion well without complications.Reason for block:procedure for pain

## 2018-04-20 NOTE — Progress Notes (Signed)
Labor Progress Note  Laura Johnson is a 22 y.o. G2P1001 at [redacted]w[redacted]d by [place] at [redacted]w[redacted]d; admitted for IOL due to decreased fetal movement and FHR decel in office  Subjective: Comfortable after epidural.   Objective: BP 94/70   Pulse 73   Temp 97.9 F (36.6 C) (Oral)   Resp 18   Ht 5\' 6"  (1.676 m)   Wt 75.3 kg   SpO2 96%   BMI 26.79 kg/m  Notable VS details:  reviewed  Fetal Assessment: FHT:  FHR: 130 bpm, variability: moderate,  accelerations:  Present,  decelerations:  Present occasional early decel.  Category/reactivity:  Category I UC:   regular, every 4 minutes; Pitocin up to 5mu/min then decreased to 59mu/min during epidural for pt comfort by nursing.  SVE:   4/90/-1, soft/anterior Membrane status: AROM Amniotic color: Clear  Labs: Lab Results  Component Value Date   WBC 13.1 (H) 04/19/2018   HGB 12.4 04/19/2018   HCT 35.2 (L) 04/19/2018   MCV 91.4 04/19/2018   PLT 218 04/19/2018    Assessment / Plan: Induction of labor due to non-reassuring fetal testing,  progressing well on pitocin  Labor: progressing normally, encouraged nursing to titrate Pitocin to adequate UC pattern.  Preeclampsia:  no sx Pre-e Fetal Wellbeing:  Category I Pain Control:  Epidural I/D:  Ampicillin q4h for GBS Anticipated MOD:  NSVD  McVey, REBECCA A, CNM 04/20/2018, 6:06 AM

## 2018-04-20 NOTE — Progress Notes (Signed)
Delivery Note  First Stage: Induction start: 2300 Labor start: 0530 Analgesia /Anesthesia intrapartum: epidural AROM at 0120  Second Stage: Complete dilation at 0832 Onset of pushing at 0840 FHR second stage Cat II, variable decels, baseline 115bpm  Delivery of a viable female infant on 04/20/2018 at 0849 by CNM delivery of fetal head in LOA position with restitution to LOT. There was no nuchal cord;  Anterior then posterior shoulders delivered easily with gentle downward traction. Baby placed on mom's chest, and attended to by peds.  Cord double clamped after cessation of pulsation, cut by FOB Cord blood sample collected    Third Stage: Placenta delivered Spontaneously intact with 3VC @ [id number] Placenta disposition: routine disposal Uterine tone firm / bleeding small  No lacerations identified  Est. Blood Loss (mL):  Complications: none  Mom to postpartum.  Baby to Couplet care / Skin to Skin.  Newborn: Birth Weight: pending  Apgar Scores: 8/9 Feeding planned: breast

## 2018-04-20 NOTE — Lactation Note (Signed)
This note was copied from a baby's chart. Lactation Consultation Note  Patient Name: Laura Johnson [id number] Date: 04/20/2018     Maternal Data  Mom was taking Effexor before pregnancy and may go back postpartum, I printed info from [facility] breastfeeding and med book and gave this to her to read and also discussed risk to baby and explanation of L2 risk factor, recommended her to take med right after a feed to decrease amt baby would receive at next feeding   Feeding Feeding Type: Breast Fed  [facility] Score                   Interventions    Lactation Tools Discussed/Used     Consult Status      Laura Johnson 04/20/2018, 4:36 PM

## 2018-04-20 NOTE — Progress Notes (Signed)
Labor Progress Note  Laura Johnson is a 22 y.o. G2P1001 at [redacted]w[redacted]d by [place] at [redacted]w[redacted]d; admitted for IOL due to decreased fetal movement and FHR decel in office.   Subjective: feeling UCs- having to breathe through some. Declines pain meds or epidural at this time.   Objective: BP 119/66 (BP Location: Left Arm)   Pulse 79   Temp 97.9 F (36.6 C) (Oral)   Resp 18   Ht 5\' 6"  (1.676 m)   Wt 75.3 kg   BMI 26.79 kg/m  Notable VS details:  reviewed  Fetal Assessment: FHT:  FHR: 125 bpm, variability: moderate,  accelerations:  Present,  decelerations:  Present early and late decel with last 2 UCs.  pt side lying, + scalp stim with SVE.  Category/reactivity:  Category II UC:   irregular, every 4-5 minutes; Pitocin at 81mu/min SVE:   4/50/-2, head well applied, cx soft/mid-position. AROM performed scant clear fluid with scant bloody show noted.  Membrane status: AROM  Amniotic color: Clear, scant fluid.   Labs: Lab Results  Component Value Date   WBC 13.1 (H) 04/19/2018   HGB 12.4 04/19/2018   HCT 35.2 (L) 04/19/2018   MCV 91.4 04/19/2018   PLT 218 04/19/2018    Assessment / Plan: IOL at 39+0wks due to DFM and decel in office. Concern for non-reassuring FHR.   Labor: more regular UCs, AROM now. Will continue to monitor closely.  Preeclampsia: no e/o pre-e Fetal Wellbeing:  Category II; + accels and no decels since AROM, will monitor closely.  Pain Control:  Labor support without medications; may have epidural on request.  I/D:  s/p 2 doses of Ampicillin Anticipated MOD:  NSVD  Laura Johnson A, CNM 04/20/2018, 1:28 AM

## 2018-04-20 NOTE — Anesthesia Preprocedure Evaluation (Addendum)
Anesthesia Evaluation  Patient identified by MRN, date of birth, ID band Patient awake    Reviewed: Allergy & Precautions, H&P , NPO status , Patient's Chart, lab work & pertinent test results  Airway Mallampati: II  TM Distance: >3 FB Neck ROM: full    Dental  (+) Teeth Intact   Pulmonary neg pulmonary ROS, former smoker,           Cardiovascular Exercise Tolerance: Good negative cardio ROS       Neuro/Psych Seizures - (seizures x 2 as a child, no AEDs),  PSYCHIATRIC DISORDERS Depression    GI/Hepatic negative GI ROS,   Endo/Other    Renal/GU   negative genitourinary   Musculoskeletal   Abdominal   Peds  Hematology negative hematology ROS (+)   Anesthesia Other Findings Past Medical History: No date: ADHD (attention deficit hyperactivity disorder) No date: Depression No date: History of chicken pox No date: Seizures (HCC)     Comment:  elementary school No date: Shingles     Comment:  right trunk  Past Surgical History: No date: TONSILLECTOMY 2014: TONSILLECTOMY AND ADENOIDECTOMY     Comment:  Dr. Chestine Spore  BMI    Body Mass Index:  26.79 kg/m      Reproductive/Obstetrics (+) Pregnancy                            Anesthesia Physical Anesthesia Plan  ASA: II  Anesthesia Plan: Epidural   Post-op Pain Management:    Induction:   PONV Risk Score and Plan:   Airway Management Planned:   Additional Equipment:   Intra-op Plan:   Post-operative Plan:   Informed Consent: I have reviewed the patients History and Physical, chart, labs and discussed the procedure including the risks, benefits and alternatives for the proposed anesthesia with the patient or authorized representative who has indicated his/her understanding and acceptance.     Plan Discussed with: Anesthesiologist  Anesthesia Plan Comments:         Anesthesia Quick Evaluation

## 2018-04-20 NOTE — Lactation Note (Signed)
This note was copied from a baby's chart. Lactation Consultation Note  Patient Name: Laura Johnson [id number]'U Date: 04/20/2018 Reason for consult: Initial assessment;1st time breastfeeding   Maternal Data Formula Feeding for Exclusion: No Does the patient have breastfeeding experience prior to this delivery?: Yes  Feeding Feeding Type: Breast Fed  LATCH Score Latch: Grasps breast easily, tongue down, lips flanged, rhythmical sucking.  Audible Swallowing: A few with stimulation  Type of Nipple: Everted at rest and after stimulation  Comfort (Breast/Nipple): Soft / non-tender  Hold (Positioning): Assistance needed to correctly position infant at breast and maintain latch.  LATCH Score: 8  Interventions Interventions: Breast feeding basics reviewed;Assisted with latch;Skin to skin  Lactation Tools Discussed/Used WIC Program: No   Consult Status Consult Status: Follow-up Date: 04/20/18 Follow-up type: In-patient    Dyann Kief 04/20/2018, 10:10 AM

## 2018-04-20 NOTE — Discharge Instructions (Signed)

## 2018-04-21 LAB — CBC
HCT: 34.5 % — ABNORMAL LOW (ref 36.0–46.0)
Hemoglobin: 11.6 g/dL — ABNORMAL LOW (ref 12.0–15.0)
MCH: 31.6 pg (ref 26.0–34.0)
MCHC: 33.6 g/dL (ref 30.0–36.0)
MCV: 94 fL (ref 80.0–100.0)
Platelets: 191 10*3/uL (ref 150–400)
RBC: 3.67 MIL/uL — AB (ref 3.87–5.11)
RDW: 13.2 % (ref 11.5–15.5)
WBC: 12.5 10*3/uL — ABNORMAL HIGH (ref 4.0–10.5)
nRBC: 0 % (ref 0.0–0.2)

## 2018-04-21 LAB — RPR: RPR Ser Ql: NONREACTIVE

## 2018-04-21 MED ORDER — IBUPROFEN 600 MG PO TABS: 600.0000 mg | ORAL_TABLET | Freq: Four times a day (QID) | ORAL | 0 refills | Status: DC

## 2018-04-21 NOTE — Anesthesia Postprocedure Evaluation (Signed)
Anesthesia Post Note  Patient: Laura Johnson  Procedure(s) Performed: AN AD HOC LABOR EPIDURAL  Patient location during evaluation: Mother Baby Anesthesia Type: Epidural Level of consciousness: awake and alert and oriented Pain management: pain level controlled Vital Signs Assessment: post-procedure vital signs reviewed and stable Respiratory status: spontaneous breathing Cardiovascular status: blood pressure returned to baseline Anesthetic complications: no     Last Vitals:  Vitals:   04/20/18 2315 04/21/18 0804  BP: (!) 116/59 113/78  Pulse: 62 69  Resp: 18 18  Temp: 36.6 C 36.7 C  SpO2: 99% 98%    Last Pain:  Vitals:   04/21/18 0804  TempSrc: Oral  PainSc:                  Neha Waight

## 2018-04-21 NOTE — Progress Notes (Signed)
Discharge instructions given. Patient verbalizes understanding of teaching. Patient discharged home via wheelchair at 1400. 

## 2018-04-21 NOTE — Discharge Summary (Signed)
Obstetric Discharge Summary Reason for Admission: onset of labor Prenatal Procedures: none Intrapartum Procedures: spontaneous vaginal delivery Postpartum Procedures: none Complications-Operative and Postpartum: none Hemoglobin  Date Value Ref Range Status  04/21/2018 11.6 (L) 12.0 - 15.0 g/dL Final   HGB  Date Value Ref Range Status  07/07/2014 13.5 12.0 - 16.0 g/dL Final   HCT  Date Value Ref Range Status  04/21/2018 34.5 (L) 36.0 - 46.0 % Final  07/07/2014 40.3 35.0 - 47.0 % Final    Physical Exam:  General: alert and cooperative Lochia: appropriate Uterine Fundus: firm Incision: n/a DVT Evaluation: No evidence of DVT seen on physical exam.  Discharge Diagnoses: Term Pregnancy-delivered  Discharge Information: Date: 04/21/2018 Activity: pelvic rest Diet: routine Medications: Ibuprofen Condition: stable Instructions: refer to practice specific booklet Discharge to: home  Breast feeding  Follow-up Information    McVey, Prudencio Pair, CNM. Schedule an appointment as soon as possible for a visit in 6 week(s).   Specialty:  Obstetrics and Gynecology Why:  For routine postpartum visit. ,  Contact information: [address] [place] [address] [phone]         pt is interested in either the IUD or going back to [facility]   Newborn Data: Live born female  Birth Weight: 8 lb 4.3 oz (3750 g) APGAR: 8, 9  Newborn Delivery   Birth date/time:  04/20/2018 08:49:00 Delivery type:  Vaginal, Spontaneous     Home with mother.  Laura Johnson 04/21/2018, 10:15 AM

## 2018-04-21 NOTE — Lactation Note (Signed)
This note was copied from a baby's chart. Lactation Consultation Note  Patient Name: Laura Johnson ZOXWR'U Date: 04/21/2018 Reason for consult: Follow-up assessment;Term Observed breast feeding.  Latched well without assistance.  Mom reports baby cluster feeding last night.  Reminded about supply and demand and need for frequent breast feeding to bring in mature milk and ensure a plentiful milk supply.  Reviewed normal course of lactation and routine newborn feeding patterns.  Lactation community resources reviewed.  Maternal Data Formula Feeding for Exclusion: No Has patient been taught Hand Expression?: Yes Does the patient have breastfeeding experience prior to this delivery?: Yes  Feeding Feeding Type: Breast Fed  LATCH Score Latch: Grasps breast easily, tongue down, lips flanged, rhythmical sucking.  Audible Swallowing: A few with stimulation  Type of Nipple: Everted at rest and after stimulation  Comfort (Breast/Nipple): Filling, red/small blisters or bruises, mild/mod discomfort  Hold (Positioning): No assistance needed to correctly position infant at breast.  LATCH Score: 8  Interventions Interventions: Breast feeding basics reviewed;Breast compression;Breast massage;Support pillows  Lactation Tools Discussed/Used WIC Program: Yes(Also has [employer])   Consult Status Consult Status: Follow-up Follow-up type: Call as needed    Louis Meckel 04/21/2018, 1:47 PM

## 2018-05-30 DIAGNOSIS — H53029 Refractive amblyopia, unspecified eye: Secondary | ICD-10-CM | POA: Diagnosis not present

## 2018-05-30 DIAGNOSIS — H5203 Hypermetropia, bilateral: Secondary | ICD-10-CM | POA: Diagnosis not present

## 2019-01-09 ENCOUNTER — Other Ambulatory Visit: Payer: Self-pay | Admitting: Hospice and Palliative Medicine

## 2019-01-09 DIAGNOSIS — Z1231 Encounter for screening mammogram for malignant neoplasm of breast: Secondary | ICD-10-CM

## 2019-01-16 DIAGNOSIS — Z20828 Contact with and (suspected) exposure to other viral communicable diseases: Secondary | ICD-10-CM | POA: Diagnosis not present

## 2019-01-17 ENCOUNTER — Inpatient Hospital Stay: Admission: RE | Admit: 2019-01-17 | Payer: Medicaid Other | Source: Ambulatory Visit

## 2019-03-01 DIAGNOSIS — Z20828 Contact with and (suspected) exposure to other viral communicable diseases: Secondary | ICD-10-CM | POA: Diagnosis not present

## 2020-04-14 ENCOUNTER — Ambulatory Visit: Payer: Self-pay

## 2020-04-14 DIAGNOSIS — N898 Other specified noninflammatory disorders of vagina: Secondary | ICD-10-CM | POA: Diagnosis not present

## 2020-04-14 DIAGNOSIS — R309 Painful micturition, unspecified: Secondary | ICD-10-CM | POA: Diagnosis not present

## 2020-04-14 DIAGNOSIS — Z113 Encounter for screening for infections with a predominantly sexual mode of transmission: Secondary | ICD-10-CM | POA: Diagnosis not present

## 2020-05-05 DIAGNOSIS — Z20822 Contact with and (suspected) exposure to covid-19: Secondary | ICD-10-CM | POA: Diagnosis not present

## 2020-05-27 DIAGNOSIS — F4322 Adjustment disorder with anxiety: Secondary | ICD-10-CM | POA: Diagnosis not present

## 2020-06-08 DIAGNOSIS — F4322 Adjustment disorder with anxiety: Secondary | ICD-10-CM | POA: Diagnosis not present

## 2020-06-22 DIAGNOSIS — F4322 Adjustment disorder with anxiety: Secondary | ICD-10-CM | POA: Diagnosis not present

## 2020-07-06 DIAGNOSIS — F4322 Adjustment disorder with anxiety: Secondary | ICD-10-CM | POA: Diagnosis not present

## 2020-07-17 DIAGNOSIS — Z03818 Encounter for observation for suspected exposure to other biological agents ruled out: Secondary | ICD-10-CM | POA: Diagnosis not present

## 2020-07-17 DIAGNOSIS — Z20822 Contact with and (suspected) exposure to covid-19: Secondary | ICD-10-CM | POA: Diagnosis not present

## 2020-07-22 DIAGNOSIS — F4322 Adjustment disorder with anxiety: Secondary | ICD-10-CM | POA: Diagnosis not present

## 2020-07-29 DIAGNOSIS — F4322 Adjustment disorder with anxiety: Secondary | ICD-10-CM | POA: Diagnosis not present

## 2020-08-04 ENCOUNTER — Encounter: Payer: Self-pay | Admitting: *Deleted

## 2020-08-10 DIAGNOSIS — F4322 Adjustment disorder with anxiety: Secondary | ICD-10-CM | POA: Diagnosis not present

## 2020-08-20 DIAGNOSIS — F411 Generalized anxiety disorder: Secondary | ICD-10-CM | POA: Diagnosis not present

## 2020-08-26 DIAGNOSIS — F4322 Adjustment disorder with anxiety: Secondary | ICD-10-CM | POA: Diagnosis not present

## 2020-08-28 DIAGNOSIS — L089 Local infection of the skin and subcutaneous tissue, unspecified: Secondary | ICD-10-CM | POA: Diagnosis not present

## 2020-08-28 DIAGNOSIS — S31135A Puncture wound of abdominal wall without foreign body, periumbilic region without penetration into peritoneal cavity, initial encounter: Secondary | ICD-10-CM | POA: Diagnosis not present

## 2020-09-07 DIAGNOSIS — F4322 Adjustment disorder with anxiety: Secondary | ICD-10-CM | POA: Diagnosis not present

## 2020-09-28 DIAGNOSIS — F4322 Adjustment disorder with anxiety: Secondary | ICD-10-CM | POA: Diagnosis not present

## 2020-10-13 DIAGNOSIS — Z124 Encounter for screening for malignant neoplasm of cervix: Secondary | ICD-10-CM | POA: Diagnosis not present

## 2020-10-13 DIAGNOSIS — Z113 Encounter for screening for infections with a predominantly sexual mode of transmission: Secondary | ICD-10-CM | POA: Diagnosis not present

## 2020-10-13 DIAGNOSIS — Z3041 Encounter for surveillance of contraceptive pills: Secondary | ICD-10-CM | POA: Diagnosis not present

## 2020-10-13 DIAGNOSIS — Z01419 Encounter for gynecological examination (general) (routine) without abnormal findings: Secondary | ICD-10-CM | POA: Diagnosis not present

## 2020-10-13 LAB — OB RESULTS CONSOLE GC/CHLAMYDIA: Chlamydia: NEGATIVE

## 2020-10-13 LAB — HM PAP SMEAR: HM Pap smear: NEGATIVE

## 2020-10-19 DIAGNOSIS — F4322 Adjustment disorder with anxiety: Secondary | ICD-10-CM | POA: Diagnosis not present

## 2020-11-24 DIAGNOSIS — F4320 Adjustment disorder, unspecified: Secondary | ICD-10-CM | POA: Diagnosis not present

## 2020-12-10 DIAGNOSIS — F4320 Adjustment disorder, unspecified: Secondary | ICD-10-CM | POA: Diagnosis not present

## 2020-12-28 DIAGNOSIS — F4320 Adjustment disorder, unspecified: Secondary | ICD-10-CM | POA: Diagnosis not present

## 2020-12-31 DIAGNOSIS — R350 Frequency of micturition: Secondary | ICD-10-CM | POA: Diagnosis not present

## 2020-12-31 DIAGNOSIS — N390 Urinary tract infection, site not specified: Secondary | ICD-10-CM | POA: Diagnosis not present

## 2021-01-14 DIAGNOSIS — F4320 Adjustment disorder, unspecified: Secondary | ICD-10-CM | POA: Diagnosis not present

## 2021-01-17 DIAGNOSIS — F4320 Adjustment disorder, unspecified: Secondary | ICD-10-CM | POA: Diagnosis not present

## 2021-01-21 DIAGNOSIS — F4320 Adjustment disorder, unspecified: Secondary | ICD-10-CM | POA: Diagnosis not present

## 2021-01-29 DIAGNOSIS — F4320 Adjustment disorder, unspecified: Secondary | ICD-10-CM | POA: Diagnosis not present

## 2021-02-11 DIAGNOSIS — F4320 Adjustment disorder, unspecified: Secondary | ICD-10-CM | POA: Diagnosis not present

## 2021-03-18 DIAGNOSIS — Z20822 Contact with and (suspected) exposure to covid-19: Secondary | ICD-10-CM | POA: Diagnosis not present

## 2021-03-18 DIAGNOSIS — U071 COVID-19: Secondary | ICD-10-CM | POA: Diagnosis not present

## 2021-03-22 DIAGNOSIS — F4320 Adjustment disorder, unspecified: Secondary | ICD-10-CM | POA: Diagnosis not present

## 2021-03-23 DIAGNOSIS — M940 Chondrocostal junction syndrome [Tietze]: Secondary | ICD-10-CM | POA: Diagnosis not present

## 2021-03-23 DIAGNOSIS — R051 Acute cough: Secondary | ICD-10-CM | POA: Diagnosis not present

## 2021-05-04 DIAGNOSIS — F4320 Adjustment disorder, unspecified: Secondary | ICD-10-CM | POA: Diagnosis not present

## 2021-05-25 DIAGNOSIS — F4320 Adjustment disorder, unspecified: Secondary | ICD-10-CM | POA: Diagnosis not present

## 2021-07-06 DIAGNOSIS — F4320 Adjustment disorder, unspecified: Secondary | ICD-10-CM | POA: Diagnosis not present

## 2021-07-29 DIAGNOSIS — F4320 Adjustment disorder, unspecified: Secondary | ICD-10-CM | POA: Diagnosis not present

## 2021-08-18 DIAGNOSIS — F4320 Adjustment disorder, unspecified: Secondary | ICD-10-CM | POA: Diagnosis not present

## 2021-09-10 DIAGNOSIS — F4320 Adjustment disorder, unspecified: Secondary | ICD-10-CM | POA: Diagnosis not present

## 2021-09-23 DIAGNOSIS — F4320 Adjustment disorder, unspecified: Secondary | ICD-10-CM | POA: Diagnosis not present

## 2021-10-28 DIAGNOSIS — F4320 Adjustment disorder, unspecified: Secondary | ICD-10-CM | POA: Diagnosis not present

## 2021-12-15 DIAGNOSIS — F4320 Adjustment disorder, unspecified: Secondary | ICD-10-CM | POA: Diagnosis not present

## 2021-12-17 ENCOUNTER — Encounter: Payer: Self-pay | Admitting: Physician Assistant

## 2021-12-17 ENCOUNTER — Ambulatory Visit: Payer: BC Managed Care – PPO | Admitting: Physician Assistant

## 2021-12-17 VITALS — BP 118/66 | HR 73 | Ht 67.0 in | Wt 129.9 lb

## 2021-12-17 DIAGNOSIS — R55 Syncope and collapse: Secondary | ICD-10-CM | POA: Diagnosis not present

## 2021-12-17 DIAGNOSIS — Z862 Personal history of diseases of the blood and blood-forming organs and certain disorders involving the immune mechanism: Secondary | ICD-10-CM | POA: Diagnosis not present

## 2021-12-17 DIAGNOSIS — F419 Anxiety disorder, unspecified: Secondary | ICD-10-CM | POA: Diagnosis not present

## 2021-12-17 DIAGNOSIS — Z87898 Personal history of other specified conditions: Secondary | ICD-10-CM | POA: Diagnosis not present

## 2021-12-17 LAB — POCT URINE PREGNANCY: Preg Test, Ur: NEGATIVE

## 2021-12-17 MED ORDER — ESCITALOPRAM OXALATE 10 MG PO TABS: 10.0000 mg | ORAL_TABLET | Freq: Every day | ORAL | 1 refills | Status: DC

## 2021-12-17 NOTE — Assessment & Plan Note (Signed)
D/t history and episodes of syncope, referring to neurology regardless of bw results as she should be evaluated for potential seizure activity.

## 2021-12-17 NOTE — Assessment & Plan Note (Signed)
Will check cbc, iron panel, vit b12, folate
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
## 2021-12-17 NOTE — Assessment & Plan Note (Addendum)
Multiple potential etiology, current plan discussed w/ pt: -labs, r/o anemia, d/t history, highest on ddx. Will test for ida, vit b12, folate.; will check cmp, tsh/t4 -if all labs WNL, will recommend heart monitor.   Orthostatic vitals today wnl. poc pregnancy test negative

## 2021-12-18 LAB — CBC WITH DIFFERENTIAL/PLATELET
Basophils Absolute: 0 10*3/uL (ref 0.0–0.2)
Basos: 0 %
EOS (ABSOLUTE): 0.1 10*3/uL (ref 0.0–0.4)
Eos: 1 %
Hematocrit: 40 % (ref 34.0–46.6)
Hemoglobin: 13.6 g/dL (ref 11.1–15.9)
Immature Grans (Abs): 0 10*3/uL (ref 0.0–0.1)
Immature Granulocytes: 0 %
Lymphocytes Absolute: 2.2 10*3/uL (ref 0.7–3.1)
Lymphs: 20 %
MCH: 31.1 pg (ref 26.6–33.0)
MCHC: 34 g/dL (ref 31.5–35.7)
MCV: 91 fL (ref 79–97)
Monocytes Absolute: 0.5 10*3/uL (ref 0.1–0.9)
Monocytes: 5 %
Neutrophils Absolute: 7.9 10*3/uL — ABNORMAL HIGH (ref 1.4–7.0)
Neutrophils: 74 %
Platelets: 274 10*3/uL (ref 150–450)
RBC: 4.38 x10E6/uL (ref 3.77–5.28)
RDW: 12.6 % (ref 11.7–15.4)
WBC: 10.7 10*3/uL (ref 3.4–10.8)

## 2021-12-18 LAB — TSH+FREE T4
Free T4: 1.22 ng/dL (ref 0.82–1.77)
TSH: 1.34 u[IU]/mL (ref 0.450–4.500)

## 2021-12-18 LAB — COMPREHENSIVE METABOLIC PANEL
ALT: 12 IU/L (ref 0–32)
AST: 30 IU/L (ref 0–40)
Albumin/Globulin Ratio: 2.4 — ABNORMAL HIGH (ref 1.2–2.2)
Albumin: 4.5 g/dL (ref 3.9–5.0)
Alkaline Phosphatase: 78 IU/L (ref 44–121)
BUN/Creatinine Ratio: 14 (ref 9–23)
BUN: 11 mg/dL (ref 6–20)
Bilirubin Total: <0.2 mg/dL (ref 0.0–1.2)
CO2: 23 mmol/L (ref 20–29)
Calcium: 9.5 mg/dL (ref 8.7–10.2)
Chloride: 103 mmol/L (ref 96–106)
Creatinine, Ser: 0.81 mg/dL (ref 0.57–1.00)
Globulin, Total: 1.9 g/dL (ref 1.5–4.5)
Glucose: 83 mg/dL (ref 70–99)
Potassium: 4.3 mmol/L (ref 3.5–5.2)
Sodium: 141 mmol/L (ref 134–144)
Total Protein: 6.4 g/dL (ref 6.0–8.5)
eGFR: 103 mL/min/{1.73_m2} (ref >59)

## 2021-12-18 LAB — IRON,TIBC AND FERRITIN PANEL
Ferritin: 40 ng/mL (ref 15–150)
Iron Saturation: 11 % — ABNORMAL LOW (ref 15–55)
Iron: 39 ug/dL (ref 27–159)
Total Iron Binding Capacity: 340 ug/dL (ref 250–450)
UIBC: 301 ug/dL (ref 131–425)

## 2021-12-18 LAB — FOLATE: Folate: 8.9 ng/mL (ref >3.0)

## 2021-12-18 LAB — VITAMIN B12: Vitamin B-12: 312 pg/mL (ref 232–1245)

## 2021-12-20 ENCOUNTER — Other Ambulatory Visit: Payer: Self-pay | Admitting: Physician Assistant

## 2021-12-20 ENCOUNTER — Encounter: Payer: Self-pay | Admitting: Physician Assistant

## 2021-12-20 DIAGNOSIS — R55 Syncope and collapse: Secondary | ICD-10-CM

## 2021-12-22 ENCOUNTER — Ambulatory Visit (INDEPENDENT_AMBULATORY_CARE_PROVIDER_SITE_OTHER): Payer: BC Managed Care – PPO

## 2021-12-22 ENCOUNTER — Other Ambulatory Visit: Payer: Self-pay | Admitting: Physician Assistant

## 2021-12-22 DIAGNOSIS — R55 Syncope and collapse: Secondary | ICD-10-CM

## 2021-12-23 ENCOUNTER — Ambulatory Visit: Payer: Self-pay

## 2021-12-23 NOTE — Telephone Encounter (Signed)
      Chief Complaint: Pt. Had to call EMS again last night for palpitations, dizziness. Asking for guidance from PCP. Declined office visit. Symptoms: No symptoms now. Frequency: Last night Pertinent Negatives: Patient denies chest pain. Disposition: [] ED /[] Urgent Care (no appt availability in office) / [] Appointment(In office/virtual)/ []  [place] [facility]/ [] Home Care/ [] Refused Recommended Disposition /[] [place] [facility]/ [x]  Follow-up with PCP Additional Notes: EMS vital signs last night - BP 130/95  PULSE 79.  Answer Assessment - Initial Assessment Questions 1. DESCRIPTION: "Please describe your heart rate or heartbeat that you are having" (e.g., fast/slow, regular/irregular, skipped or extra beats, "palpitations")     Palpitations 2. ONSET: "When did it start?" (Minutes, hours or days)      Last night 3. DURATION: "How long does it last" (e.g., seconds, minutes, hours)     Minutes 4. PATTERN "Does it come and go, or has it been constant since it started?"  "Does it get worse with exertion?"   "Are you feeling it now?"     Comes and goes 5. TAP: "Using your hand, can you tap out what you are feeling on a chair or table in front of you, so that I can hear?" (Note: not all patients can do this)       No 6. HEART RATE: "Can you tell me your heart rate?" "How many beats in 15 seconds?"  (Note: not all patients can do this)       EMS CAME TO HOME 7. RECURRENT SYMPTOM: "Have you ever had this before?" If Yes, ask: "When was the last time?" and "What happened that time?"      yES 8. CAUSE: "What do you think is causing the palpitations?"     uNSURE 9. CARDIAC HISTORY: "Do you have any history of heart disease?" (e.g., heart attack, angina, bypass surgery, angioplasty, arrhythmia)      No 10. OTHER SYMPTOMS: "Do you have any other symptoms?" (e.g., dizziness, chest pain, sweating, difficulty breathing)       Dizziness 11. PREGNANCY: "Is there any chance you are pregnant?"  "When was your last menstrual period?"       No  Protocols used: Heart Rate and Heartbeat Questions-A-AH

## 2021-12-23 NOTE — Telephone Encounter (Signed)
Called pt to discuss Heart monitor should show up tomorrow Advised if it occurs again she should go to ED for evaluation

## 2021-12-26 DIAGNOSIS — R55 Syncope and collapse: Secondary | ICD-10-CM | POA: Diagnosis not present

## 2022-01-03 ENCOUNTER — Emergency Department (HOSPITAL_COMMUNITY): Payer: BC Managed Care – PPO

## 2022-01-03 ENCOUNTER — Emergency Department (HOSPITAL_COMMUNITY)
Admission: EM | Admit: 2022-01-03 | Discharge: 2022-01-03 | Disposition: A | Discharge status: Home / Self Care | Payer: BC Managed Care – PPO | Attending: Emergency Medicine | Admitting: Emergency Medicine

## 2022-01-03 ENCOUNTER — Other Ambulatory Visit: Payer: Self-pay

## 2022-01-03 DIAGNOSIS — R202 Paresthesia of skin: Secondary | ICD-10-CM | POA: Diagnosis not present

## 2022-01-03 DIAGNOSIS — R55 Syncope and collapse: Secondary | ICD-10-CM | POA: Insufficient documentation

## 2022-01-03 DIAGNOSIS — R002 Palpitations: Secondary | ICD-10-CM | POA: Insufficient documentation

## 2022-01-03 DIAGNOSIS — R531 Weakness: Secondary | ICD-10-CM | POA: Diagnosis not present

## 2022-01-03 DIAGNOSIS — I959 Hypotension, unspecified: Secondary | ICD-10-CM | POA: Diagnosis not present

## 2022-01-03 LAB — CBC WITH DIFFERENTIAL/PLATELET
Abs Immature Granulocytes: 0.03 10*3/uL (ref 0.00–0.07)
Basophils Absolute: 0 10*3/uL (ref 0.0–0.1)
Basophils Relative: 1 %
Eosinophils Absolute: 0.1 10*3/uL (ref 0.0–0.5)
Eosinophils Relative: 1 %
HCT: 44.1 % (ref 36.0–46.0)
Hemoglobin: 15 g/dL (ref 12.0–15.0)
Immature Granulocytes: 1 %
Lymphocytes Relative: 26 %
Lymphs Abs: 1.7 10*3/uL (ref 0.7–4.0)
MCH: 31.4 pg (ref 26.0–34.0)
MCHC: 34 g/dL (ref 30.0–36.0)
MCV: 92.3 fL (ref 80.0–100.0)
Monocytes Absolute: 0.4 10*3/uL (ref 0.1–1.0)
Monocytes Relative: 6 %
Neutro Abs: 4.3 10*3/uL (ref 1.7–7.7)
Neutrophils Relative %: 65 %
Platelets: 298 10*3/uL (ref 150–400)
RBC: 4.78 MIL/uL (ref 3.87–5.11)
RDW: 12.8 % (ref 11.5–15.5)
WBC: 6.6 10*3/uL (ref 4.0–10.5)
nRBC: 0 % (ref 0.0–0.2)

## 2022-01-03 LAB — BASIC METABOLIC PANEL
Anion gap: 6 (ref 5–15)
BUN: 13 mg/dL (ref 6–20)
CO2: 25 mmol/L (ref 22–32)
Calcium: 9.3 mg/dL (ref 8.9–10.3)
Chloride: 105 mmol/L (ref 98–111)
Creatinine, Ser: 0.71 mg/dL (ref 0.44–1.00)
GFR, Estimated: >60 mL/min (ref >60)
Glucose, Bld: 90 mg/dL (ref 70–99)
Potassium: 3.7 mmol/L (ref 3.5–5.1)
Sodium: 136 mmol/L (ref 135–145)

## 2022-01-03 LAB — TSH: TSH: 0.935 u[IU]/mL (ref 0.350–4.500)

## 2022-01-03 LAB — I-STAT BETA HCG BLOOD, ED (MC, WL, AP ONLY): I-stat hCG, quantitative: <5 m[IU]/mL (ref <5)

## 2022-01-03 LAB — CBG MONITORING, ED: Glucose-Capillary: 103 mg/dL — ABNORMAL HIGH (ref 70–99)

## 2022-01-03 LAB — MAGNESIUM: Magnesium: 1.9 mg/dL (ref 1.7–2.4)

## 2022-01-03 LAB — TROPONIN I (HIGH SENSITIVITY)
Troponin I (High Sensitivity): 3 ng/L (ref <18)
Troponin I (High Sensitivity): <2 ng/L (ref <18)

## 2022-01-03 LAB — D-DIMER, QUANTITATIVE: D-Dimer, Quant: <0.27 ug/mL-FEU (ref 0.00–0.50)

## 2022-01-03 MED ORDER — SODIUM CHLORIDE 0.9 % IV BOLUS
500.0000 mL | Freq: Once | INTRAVENOUS | Status: AC
Start: 2022-01-03 — End: 2022-01-03
  Administered 2022-01-03: 500 mL via INTRAVENOUS

## 2022-01-05 ENCOUNTER — Encounter: Payer: Self-pay | Admitting: Physician Assistant

## 2022-01-10 ENCOUNTER — Encounter: Payer: Self-pay | Admitting: Cardiology

## 2022-01-10 ENCOUNTER — Ambulatory Visit: Payer: BC Managed Care – PPO | Admitting: Cardiology

## 2022-01-10 VITALS — BP 112/79 | HR 73 | Ht 66.0 in | Wt 127.8 lb

## 2022-01-10 DIAGNOSIS — R55 Syncope and collapse: Secondary | ICD-10-CM | POA: Diagnosis not present

## 2022-01-10 DIAGNOSIS — R002 Palpitations: Secondary | ICD-10-CM | POA: Diagnosis not present

## 2022-01-10 NOTE — Patient Instructions (Signed)
Medication Instructions:   Your physician recommends that you continue on your current medications as directed. Please refer to the Current Medication list given to you today.  *If you need a refill on your cardiac medications before your next appointment, please call your pharmacy*    Testing/Procedures:  Your physician has requested that you have an echocardiogram. Echocardiography is a painless test that uses sound waves to create images of your heart. It provides your doctor with information about the size and shape of your heart and how well your heart's chambers and valves are working. This procedure takes approximately one hour. There are no restrictions for this procedure.   Follow-Up: At [facility], you and your health needs are our priority.  As part of our continuing mission to provide you with exceptional heart care, we have created designated Provider Care Teams.  These Care Teams include your primary Cardiologist (physician) and Advanced Practice Providers (APPs -  Physician Assistants and Nurse Practitioners) who all work together to provide you with the care you need, when you need it.  We recommend signing up for the patient portal called "MyChart".  Sign up information is provided on this After Visit Summary.  MyChart is used to connect with patients for Virtual Visits (Telemedicine).  Patients are able to view lab/test results, encounter notes, upcoming appointments, etc.  Non-urgent messages can be sent to your provider as well.   To learn more about what you can do with MyChart, go to [email or web address].    Your next appointment:   Follow up after Echo   The format for your next appointment:   In Person  Provider:   You may see Debbe Odea, MD or one of the following Advanced Practice Providers on your designated Care Team:   Nicolasa Ducking, NP Eula Listen, PA-C Cadence Fransico Michael, [place]    Other Instructions   Printed  Vasovagal information from  [facility] at  [email or web address]  Important Information About Sugar
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
## 2022-01-24 DIAGNOSIS — Z113 Encounter for screening for infections with a predominantly sexual mode of transmission: Secondary | ICD-10-CM | POA: Diagnosis not present

## 2022-01-24 DIAGNOSIS — Z01419 Encounter for gynecological examination (general) (routine) without abnormal findings: Secondary | ICD-10-CM | POA: Diagnosis not present

## 2022-01-24 DIAGNOSIS — Z3041 Encounter for surveillance of contraceptive pills: Secondary | ICD-10-CM | POA: Diagnosis not present

## 2022-01-28 ENCOUNTER — Ambulatory Visit (INDEPENDENT_AMBULATORY_CARE_PROVIDER_SITE_OTHER): Payer: BC Managed Care – PPO | Admitting: Physician Assistant

## 2022-01-28 ENCOUNTER — Encounter: Payer: Self-pay | Admitting: Physician Assistant

## 2022-01-28 VITALS — BP 111/80 | HR 81 | Wt 127.6 lb

## 2022-01-28 DIAGNOSIS — F419 Anxiety disorder, unspecified: Secondary | ICD-10-CM | POA: Diagnosis not present

## 2022-01-28 DIAGNOSIS — R55 Syncope and collapse: Secondary | ICD-10-CM | POA: Diagnosis not present

## 2022-01-28 MED ORDER — ESCITALOPRAM OXALATE 5 MG PO TABS: 5.0000 mg | ORAL_TABLET | Freq: Every day | ORAL | 1 refills | Status: DC

## 2022-01-28 MED ORDER — ALPRAZOLAM 0.5 MG PO TABS: 0.5000 mg | ORAL_TABLET | Freq: Two times a day (BID) | ORAL | 0 refills | Status: AC | PRN

## 2022-01-28 NOTE — Progress Notes (Unsigned)
     I,Laura Johnson,acting as a Neurosurgeon for [employer], PA-C.,have documented all relevant documentation on the behalf of Laura Ferguson, PA-C,as directed by  Laura Ferguson, PA-C while in the presence of Laura Ferguson, PA-C.   Established patient visit   Patient: Laura Johnson   DOB: [birth year]   26 y.o. Female  MRN: [id number] Visit Date: 01/28/2022  Today's healthcare provider: [name], PA-C   No chief complaint on file.  Subjective    HPI  -Did not see neurology. Reports she did go to see the cardiologist and its was over $100 for co-pay and she is a single mother.    Anxiety, Follow-up  She was last seen for anxiety 6 weeks ago. Changes made at last visit include start lexapro 10 mg daily.   She reports excellent compliance with treatment. She reports fair tolerance of treatment. She is having side effects. {Patient states she can not reach the moon when having intercourse and more outspoken}  She feels her anxiety is moderate and Improved since last visit.    Symptoms: No chest pain No difficulty concentrating  Yes dizziness Yes fatigue  No feelings of losing control No insomnia  Yes irritable Yes palpitations  No panic attacks No racing thoughts  No shortness of breath No sweating  No tremors/shakes    GAD-7 Results    12/17/2021   10:52 AM  GAD-7 Generalized Anxiety Disorder Screening Tool  1. Feeling Nervous, Anxious, or on Edge 3  2. Not Being Able to Stop or Control Worrying 2  3. Worrying Too Much About Different Things 3  4. Trouble Relaxing 2  5. Being So Restless it's Hard To Sit Still 3  6. Becoming Easily Annoyed or Irritable 1  7. Feeling Afraid As If Something Awful Might Happen 1  Total GAD-7 Score 15  Difficulty At Work, Home, or Getting  Along With Others? Not difficult at all    PHQ-9 Scores    12/17/2021    1:06 PM  PHQ9 SCORE ONLY  PHQ-9 Total Score 6     ---------------------------------------------------------------------------------------------------   Medications: Outpatient Medications Prior to Visit  Medication Sig   cyclobenzaprine (FLEXERIL) 10 MG tablet Take 10 mg by mouth 3 (three) times daily as needed for muscle spasms.   escitalopram (LEXAPRO) 10 MG tablet Take 1 tablet (10 mg total) by mouth daily.   ibuprofen (ADVIL) 200 MG tablet Take 400 mg by mouth every 6 (six) hours as needed for mild pain or headache.   norethindrone-ethinyl estradiol (JUNEL 1/20) 1-20 MG-MCG tablet Take 1 tablet by mouth daily.   No facility-administered medications prior to visit.    Review of Systems  {Labs  Heme  Chem  Endocrine  Serology  Results Review (optional):23779}   Objective    There were no vitals taken for this visit. {Show previous vital signs (optional):23777}  Physical Exam  ***  No results found for any visits on 01/28/22.  Assessment & Plan     ***  No follow-ups on file.      {provider attestation***:1}   Laura Ferguson, PA-C  [facility] [phone] (phone) [phone] (fax)  [facility]

## 2022-01-31 ENCOUNTER — Encounter: Payer: Self-pay | Admitting: Physician Assistant

## 2022-01-31 NOTE — Assessment & Plan Note (Addendum)
Decrease lexapro dose to 5 mg daily. F/u 6-8 weeks.   Pt has significant anxiety over flying ; anxiety seems to partially trigger her syncopal episodes. Advised she try xanax 0.5 mg before flying.

## 2022-01-31 NOTE — Assessment & Plan Note (Signed)
Advised pt to continue to follow with cardiology -- but if echo is normal, strongly advise her next appt be neurology.   In the meantime -- working dx of vasovagal syncope - advised increasing salt in her diet.

## 2022-02-02 DIAGNOSIS — H6691 Otitis media, unspecified, right ear: Secondary | ICD-10-CM | POA: Diagnosis not present

## 2022-02-10 ENCOUNTER — Other Ambulatory Visit: Payer: BC Managed Care – PPO

## 2022-02-28 ENCOUNTER — Ambulatory Visit: Payer: BC Managed Care – PPO | Admitting: Medical

## 2022-03-04 ENCOUNTER — Ambulatory Visit: Payer: BC Managed Care – PPO | Admitting: Cardiology

## 2022-03-08 ENCOUNTER — Ambulatory Visit: Payer: BC Managed Care – PPO

## 2022-03-15 ENCOUNTER — Ambulatory Visit: Payer: BC Managed Care – PPO | Attending: Cardiology

## 2022-03-15 DIAGNOSIS — R55 Syncope and collapse: Secondary | ICD-10-CM

## 2022-03-15 LAB — ECHOCARDIOGRAM COMPLETE
AR max vel: 2.73 cm2
AV Area VTI: 2.7 cm2
AV Area mean vel: 2.62 cm2
AV Mean grad: 3 mmHg
AV Peak grad: 5.1 mmHg
Ao pk vel: 1.13 m/s
Area-P 1/2: 2.57 cm2
S' Lateral: 3.2 cm
Single Plane A4C EF: 49.8 %

## 2022-03-25 ENCOUNTER — Encounter: Payer: Self-pay | Admitting: Cardiology

## 2022-03-25 ENCOUNTER — Ambulatory Visit: Payer: BC Managed Care – PPO | Attending: Cardiology | Admitting: Cardiology

## 2022-03-25 VITALS — BP 118/78 | HR 63 | Ht 66.0 in | Wt 136.0 lb

## 2022-03-25 DIAGNOSIS — R55 Syncope and collapse: Secondary | ICD-10-CM | POA: Diagnosis not present

## 2022-03-25 NOTE — Patient Instructions (Signed)
Medication Instructions:   Your physician recommends that you continue on your current medications as directed. Please refer to the Current Medication list given to you today.  *If you need a refill on your cardiac medications before your next appointment, please call your pharmacy*    Follow-Up: At [facility], you and your health needs are our priority.  As part of our continuing mission to provide you with exceptional heart care, we have created designated Provider Care Teams.  These Care Teams include your primary Cardiologist (physician) and Advanced Practice Providers (APPs -  Physician Assistants and Nurse Practitioners) who all work together to provide you with the care you need, when you need it.  We recommend signing up for the patient portal called "MyChart".  Sign up information is provided on this After Visit Summary.  MyChart is used to connect with patients for Virtual Visits (Telemedicine).  Patients are able to view lab/test results, encounter notes, upcoming appointments, etc.  Non-urgent messages can be sent to your provider as well.   To learn more about what you can do with MyChart, go to [email or web address].    Your next appointment:   Follow up as needed   The format for your next appointment:   In Person  Provider:   Brian Agbor-Etang, MD    Other Instructions   Important Information About Sugar       

## 2022-03-25 NOTE — Progress Notes (Signed)
Cardiology Office Note:    Date:  03/25/2022   ID:  Laura Johnson [birth year], MRN [id number]  PCP:  [name], PA-C   [facility] Providers Cardiologist:  None     Referring MD: [name], PA-C   Chief Complaint  Patient presents with   Other    Follow up post ECHO -- Meds reviewed verbally with patient      History of Present Illness:    Laura Johnson is a 26 y.o. female with a hx of anxiety who presents for follow-up.  Previously seen due to dizziness and syncope.  Cardiac monitor previously placed, echocardiogram was ordered to evaluate any structural abnormalities.  She states having prodromal symptoms including dizziness, tunnel vision, flushing prior to passing out.  Presents for testing results.  No new concerns at this time.  No further episodes of passing out.   Past Medical History:  Diagnosis Date   ADHD (attention deficit hyperactivity disorder)    Allergy    Anemia    Anxiety    Depression    History of chicken pox    Seizures (HCC)    elementary school   Shingles    right trunk    Past Surgical History:  Procedure Laterality Date   BREAST SURGERY     TONSILLECTOMY     TONSILLECTOMY AND ADENOIDECTOMY  07/11/2012   Dr. Chestine Spore    Current Medications: Current Meds  Medication Sig   ALPRAZolam (XANAX) 0.5 MG tablet Take 1 tablet (0.5 mg total) by mouth 2 (two) times daily as needed for anxiety.   cyclobenzaprine (FLEXERIL) 10 MG tablet Take 10 mg by mouth 3 (three) times daily as needed for muscle spasms.   escitalopram (LEXAPRO) 5 MG tablet Take 1 tablet (5 mg total) by mouth daily.   ibuprofen (ADVIL) 200 MG tablet Take 400 mg by mouth every 6 (six) hours as needed for mild pain or headache.   norethindrone-ethinyl estradiol (JUNEL 1/20) 1-20 MG-MCG tablet Take 1 tablet by mouth daily.     Allergies:   Celery (apium graveolens var. dulce) skin test, Watermelon flavor, and Wild lettuce extract (lactuca virosa)    Social History   Socioeconomic History   Marital status: Married    Spouse name: Not on file   Number of children: 2   Years of education: Not on file   Highest education level: Not on file  Occupational History   Not on file  Tobacco Use   Smoking status: Former    Types: Cigarettes, E-cigarettes   Smokeless tobacco: Never  Vaping Use   Vaping Use: Former  Substance and Sexual Activity   Alcohol use: Yes    Alcohol/week: 1.0 - 2.0 standard drink of alcohol    Types: 1 - 2 Glasses of wine per week    Comment: on occ   Drug use: No   Sexual activity: Yes    Birth control/protection: Pill  Other Topics Concern   Not on file  Social History Narrative   Lives in [place] with mother. Has 2 dogs.      Work - [employer]      Diet - regular      Exercise - walks at work   Social Determinants of Longs Drug Stores: Low Risk  (04/19/2018)   Overall Financial Resource Strain (CARDIA)    Difficulty of Paying Living Expenses: Not hard at all  Food Insecurity: No Food Insecurity (04/19/2018)   Hunger Vital Sign  Worried About Programme researcher, broadcasting/film/video in the Last Year: Never true    Ran Out of Food in the Last Year: Never true  Transportation Needs: No Transportation Needs (04/19/2018)   PRAPARE - Administrator, Civil Service (Medical): No    Lack of Transportation (Non-Medical): No  Physical Activity: Sufficiently Active (04/19/2018)   Exercise Vital Sign    Days of Exercise per Week: 4 days    Minutes of Exercise per Session: 40 min  Stress: No Stress Concern Present (04/19/2018)   [employer] of Occupational Health - Occupational Stress Questionnaire    Feeling of Stress : Not at all  Social Connections: Somewhat Isolated (04/19/2018)   Social Connection and Isolation Panel [NHANES]    Frequency of Communication with Friends and Family: Three times a week    Frequency of Social Gatherings with Friends and Family: Three times a week     Attends Religious Services: Never    Active Member of Clubs or Organizations: No    Attends Engineer, structural: Never    Marital Status: Married     Family History: The patient's family history includes Anxiety disorder in her maternal grandmother and mother; Arthritis in her mother; Atrial fibrillation in her maternal grandmother; Dementia in her maternal grandfather; Depression in her father; Heart attack in her maternal aunt; Leukemia in her maternal grandmother; Supraventricular tachycardia in her maternal aunt.  ROS:   Please see the history of present illness.     All other systems reviewed and are negative.  EKGs/Labs/Other Studies Reviewed:    The following studies were reviewed today:   EKG:  EKG not ordered today.    Recent Labs: 12/17/2021: ALT 12 01/03/2022: BUN 13; Creatinine, Ser 0.71; Hemoglobin 15.0; Magnesium 1.9; Platelets 298; Potassium 3.7; Sodium 136; TSH 0.935  Recent Lipid Panel No results found for: "CHOL", "TRIG", "HDL", "CHOLHDL", "VLDL", "LDLCALC", "LDLDIRECT"   Risk Assessment/Calculations:         Physical Exam:    VS:  BP 118/78 (BP Location: Left Arm, Patient Position: Sitting, Cuff Size: Normal)   Pulse 63   Ht 5\' 6"  (1.676 m)   Wt 136 lb (61.7 kg)   SpO2 98%   BMI 21.95 kg/m     Wt Readings from Last 3 Encounters:  03/25/22 136 lb (61.7 kg)  01/28/22 127 lb 9.6 oz (57.9 kg)  01/10/22 127 lb 12.8 oz (58 kg)     GEN:  Well nourished, well developed in no acute distress HEENT: Normal NECK: No JVD; No carotid bruits CARDIAC: RRR, no murmurs, rubs, gallops RESPIRATORY:  Clear to auscultation without rales, wheezing or rhonchi  ABDOMEN: Soft, non-tender, non-distended MUSCULOSKELETAL:  No edema; No deformity  SKIN: Warm and dry NEUROLOGIC:  Alert and oriented x 3 PSYCHIATRIC:  Normal affect   ASSESSMENT:    1. Syncope and collapse     PLAN:    In order of problems listed above:  Syncope,  prodromal symptoms of  dizziness, tunnel vision prior to passing out, suggesting vasovagal etiology.  Echo shows normal systolic and diastolic function, with no gross structural abnormalities.  Cardiac monitor with no significant arrhythmias.  safety precautions emphasized whenever prodromal symptoms arise.   Follow-up as needed     Medication Adjustments/Labs and Tests Ordered: Current medicines are reviewed at length with the patient today.  Concerns regarding medicines are outlined above.  No orders of the defined types were placed in this encounter.  No orders of the defined  types were placed in this encounter.   Patient Instructions  Medication Instructions:   Your physician recommends that you continue on your current medications as directed. Please refer to the Current Medication list given to you today.  *If you need a refill on your cardiac medications before your next appointment, please call your pharmacy*   Follow-Up: At [facility], you and your health needs are our priority.  As part of our continuing mission to provide you with exceptional heart care, we have created designated Provider Care Teams.  These Care Teams include your primary Cardiologist (physician) and Advanced Practice Providers (APPs -  Physician Assistants and Nurse Practitioners) who all work together to provide you with the care you need, when you need it.  We recommend signing up for the patient portal called "MyChart".  Sign up information is provided on this After Visit Summary.  MyChart is used to connect with patients for Virtual Visits (Telemedicine).  Patients are able to view lab/test results, encounter notes, upcoming appointments, etc.  Non-urgent messages can be sent to your provider as well.   To learn more about what you can do with MyChart, go to [email or web address].    Your next appointment:   Follow up as needed   The format for your next appointment:   In Person  Provider:   Debbe Odea, MD    Other Instructions   Important Information About Sugar         Signed, Debbe Odea, MD  03/25/2022 9:56 AM    [facility]

## 2022-04-06 DIAGNOSIS — F4320 Adjustment disorder, unspecified: Secondary | ICD-10-CM | POA: Diagnosis not present

## 2022-04-25 ENCOUNTER — Other Ambulatory Visit: Payer: Self-pay | Admitting: Physician Assistant

## 2022-04-27 ENCOUNTER — Encounter: Payer: Self-pay | Admitting: Cardiology

## 2022-07-07 ENCOUNTER — Ambulatory Visit: Admit: 2022-07-07 | Discharge status: Against Medical Advice | Payer: BC Managed Care – PPO

## 2022-07-07 ENCOUNTER — Ambulatory Visit: Payer: Self-pay

## 2022-07-07 DIAGNOSIS — L0100 Impetigo, unspecified: Secondary | ICD-10-CM | POA: Diagnosis not present

## 2022-07-07 DIAGNOSIS — L03221 Cellulitis of neck: Secondary | ICD-10-CM | POA: Diagnosis not present

## 2022-07-07 NOTE — Telephone Encounter (Signed)
UC encounter has been opened in chart.   Agree with UC/ED evaluation given painful nature of injury and redness. Would recommend in person evaluation to treat as appropriately as possible.  Recommend scheduling office visit for follow-up after acute visit to either urgent care or ED.  Ronnald Ramp, MD [facility]

## 2022-07-07 NOTE — Telephone Encounter (Signed)
    Chief Complaint: Insect bite to back of neck. Red, pus draining, painful.No availability. Refuses UC. Asking to be worked in today. Symptoms: Above Frequency: 2 days ago Pertinent Negatives: Patient denies fever. Disposition: [] ED /[] Urgent Care (no appt availability in office) / [] Appointment(In office/virtual)/ []  [facility] [facility]/ [] Home Care/ [] Refused Recommended Disposition /[] [facility] [facility]/ []  Follow-up with PCP Additional Notes: Please advise pt.  Reason for Disposition  [1] SEVERE bite pain AND [2] not improved after 2 hours of pain medicine  Answer Assessment - Initial Assessment Questions 1. TYPE of INSECT: "What type of insect was it?"      Unsure 2. ONSET: "When did you get bitten?"      2 days ago 3. LOCATION: "Where is the insect bite located?"      Back of neck 4. REDNESS: "Is the area red or pink?" If Yes, ask: "What size is area of redness?" (inches or cm). "When did the redness start?"     Yes 5. PAIN: "Is there any pain?" If Yes, ask: "How bad is it?"  (Scale 1-10; or mild, moderate, severe)     7 6. ITCHING: "Does it itch?" If Yes, ask: "How bad is the itch?"    - MILD: doesn't interfere with normal activities   - MODERATE-SEVERE: interferes with work, school, sleep, or other activities       7. SWELLING: "How big is the swelling?" (inches, cm, or compare to coins)     3 large lumps 8. OTHER SYMPTOMS: "Do you have any other symptoms?"  (e.g., difficulty breathing, hives)      9. PREGNANCY: "Is there any chance you are pregnant?" "When was your last menstrual period?"     N/a  Protocols used: Insect Bite-A-AH

## 2022-07-08 ENCOUNTER — Ambulatory Visit: Payer: Self-pay

## 2022-07-08 NOTE — Telephone Encounter (Signed)
  Chief Complaint: medication assistance  Symptoms: cellulitis impetigo and staph infection on neck Frequency: yesterday Pertinent Negatives: NA Disposition: [] ED /[] Urgent Care (no appt availability in office) / [x] Appointment(In office/virtual)/ []  [facility]/ [x] Home Care/ [] Refused Recommended Disposition /[] [facility]/ []  Follow-up with PCP Additional Notes: pt wanting to prevent spread of infection. Went to UC yesterday was put on Bactrim BID x 10 days and mupurion cream. She concerned about showering with it open since place is draining so advised can cover with a bandaid and just to make sure proper handwashing and not use same washcloth as there as rest of body. Scheduled for FU appt on 07/21/22 at 1600. Pt also asked about alternative for diflucan since has interactions with lexapro for arrhthymias and pt already has heart problems. Recommended pt call UC to see if they can prescribe something different or could try AZO yeast OTC. Pt verbalized understanding.   Summary: cellulitis on neck   Pt called saying she went to [facility] yesterday for cellulitis on her neck.  They gave her an antibiotic and an ointment.  She has question on how to not get it to spread.  She said she tried to call them but no answer.  CB#  [phone]         Reason for Disposition  Caller has medicine question only, adult not sick, AND triager answers question  Answer Assessment - Initial Assessment Questions 1. NAME of MEDICINE: "What medicine(s) are you calling about?"     bactrim 2. QUESTION: "What is your question?" (e.g., double dose of medicine, side effect)     Wanting to prevent spread of infection 3. PRESCRIBER: "Who prescribed the medicine?" Reason: if prescribed by specialist, call should be referred to that group.     UC 4. SYMPTOMS: "Do you have any symptoms?" If Yes, ask: "What symptoms are you having?"  "How bad are the symptoms (e.g., mild, moderate, severe)      Cellulitis impetigo and staph infection on neck  Protocols used: Medication Question Call-A-AH

## 2022-07-17 ENCOUNTER — Other Ambulatory Visit: Payer: Self-pay | Admitting: Physician Assistant

## 2022-07-18 ENCOUNTER — Other Ambulatory Visit: Payer: Self-pay | Admitting: Physician Assistant

## 2022-07-18 DIAGNOSIS — F419 Anxiety disorder, unspecified: Secondary | ICD-10-CM

## 2022-07-18 NOTE — Telephone Encounter (Signed)
Medication Refill - Medication: Escitalopram Oxalate 10 mg Was refused because says thought was decreasilng to 5mg , but pt wants to keep it at 10 mg   Has the patient contacted their pharmacy? yes (Agent: If no, request that the patient contact the pharmacy for the refill. If patient does not wish to contact the pharmacy document the reason why and proceed with request.) (Agent: If yes, when and what did the pharmacy advise?)contact pcp  Preferred Pharmacy (with phone number or street name):  [id number] - [place], [place] - [address] [place], [place] [address] Phone: [phone]  Fax: [phone]   Has the patient been seen for an appointment in the last year OR does the patient have an upcoming appointment? yes  Agent: Please be advised that RX refills may take up to 3 business days. We ask that you follow-up with your pharmacy.

## 2022-07-19 ENCOUNTER — Other Ambulatory Visit: Payer: Self-pay | Admitting: Physician Assistant

## 2022-07-19 MED ORDER — ESCITALOPRAM OXALATE 5 MG PO TABS: 5.0000 mg | ORAL_TABLET | Freq: Every day | ORAL | 0 refills | Status: DC

## 2022-07-19 NOTE — Telephone Encounter (Signed)
Patient will need an office visit for further refills. Requested Prescriptions  Pending Prescriptions Disp Refills   escitalopram (LEXAPRO) 5 MG tablet 90 tablet 0    Sig: Take 1 tablet (5 mg total) by mouth daily.     Psychiatry:  Antidepressants - SSRI Passed - 07/18/2022 12:14 PM      Passed - Valid encounter within last 6 months    Recent Outpatient Visits           5 months ago Jennings, PA-C   7 months ago Syncope, unspecified syncope type   [facility] Mikey Kirschner, [place]

## 2022-07-20 ENCOUNTER — Ambulatory Visit: Payer: Self-pay | Admitting: *Deleted

## 2022-07-20 NOTE — Telephone Encounter (Signed)
Message from Loma Boston sent at 07/20/2022  8:31 AM EST  Summary: Med increase   Pls fu with pt re escitalopram (LEXAPRO) 5 MG tablet vs 10MG  Pt was given 10 mg on 6/9 was not comfortable on 10 MG Pt was given 5 mg 7/21 Pt discussed with Ria Comment at visit this week she would go back to 10 MG, refill processed at 5 mg again. Pt wanting to know if can take the 2 mg but not really comfortable with that and wants a new script for the 10 mg, PLEASE CALL PT to advise she does not want to take 2 5mg  without a fu call. Pt tates she may be with a pt and to please leave a message and # also for her to call back [phone]          Call History   Type Contact Phone/Fax User  07/20/2022 08:26 AM EST Phone (Incoming) Dorene, Bruni Dell (Self) [phone] Lemmie Evens) Loma Boston   Reason for Disposition  [1] Caller has URGENT medicine question about med that PCP or specialist prescribed AND [2] triager unable to answer question  Answer Assessment - Initial Assessment Questions 1. NAME of MEDICINE: "What medicine(s) are you calling about?"     Lexapro 2. QUESTION: "What is your question?" (e.g., double dose of medicine, side effect)     I filled the 5 mg because I was without the medication for the last 3 days and I can't go that long without being on this.     I stayed on the 10 mg so I never took the 5 mg she ordered for me at my last visit.    I'm not having heart issues now since staying on the 10 mg.    I think I needed time for my body to acclimate to the 10 mg.    So I would like to stay on the 10 mg.   Will she write an rx for the 10 mg?  The health issues with my heart was why she wanted to try the 5 mg.    I was passing out during sexual relations but that has resolved. 3. PRESCRIBER: "Who prescribed the medicine?" Reason: if prescribed by specialist, call should be referred to that group.     Mikey Kirschner, PA-C 4. SYMPTOMS: "Do you have any symptoms?" If Yes, ask: "What symptoms are  you having?"  "How bad are the symptoms (e.g., mild, moderate, severe)     No more heart issues since staying on the 10 mg.   She was having issues with passing out during sexual relations. 5. PREGNANCY:  "Is there any chance that you are pregnant?" "When was your last menstrual period?"     Not asked  Protocols used: Medication Question Call-A-AH  Chief Complaint: Requesting to stay on the 10 mg of Lexapro Symptoms: No heart issues since staying on the 10 mg instead of the 5 mg that was ordered during last OV. Frequency: N/A Pertinent Negatives: Patient denies any more passing out issues with the 10 mg Lexapro. Disposition: [] ED /[] Urgent Care (no appt availability in office) / [] Appointment(In office/virtual)/ []  [place] [facility]/ [] Home Care/ [] Refused Recommended Disposition /[] [place] [facility]/ [x]  Follow-up with PCP Additional Notes: Message sent to Mikey Kirschner, PA-C.   Pt. Agreeable to someone calling her back.

## 2022-07-21 ENCOUNTER — Ambulatory Visit: Payer: BC Managed Care – PPO | Admitting: Physician Assistant

## 2022-07-21 NOTE — Telephone Encounter (Signed)
Patient advised. Verbalized understanding. Advised to request refill once she is getting low so new rx can be sent in for 10 mg.  Ptient will double up on her 5 mg for now. Advised to schedule f/u appt in in the next month.

## 2022-08-26 DIAGNOSIS — F4320 Adjustment disorder, unspecified: Secondary | ICD-10-CM | POA: Diagnosis not present

## 2022-09-26 ENCOUNTER — Other Ambulatory Visit: Payer: Self-pay | Admitting: Physician Assistant

## 2022-09-26 NOTE — Telephone Encounter (Signed)
Pt called to report that she is completely out of her current supply 

## 2022-12-22 ENCOUNTER — Other Ambulatory Visit: Payer: Self-pay | Admitting: Physician Assistant

## 2023-05-23 DIAGNOSIS — N898 Other specified noninflammatory disorders of vagina: Secondary | ICD-10-CM | POA: Diagnosis not present

## 2023-05-23 DIAGNOSIS — Z1331 Encounter for screening for depression: Secondary | ICD-10-CM | POA: Diagnosis not present

## 2023-05-23 DIAGNOSIS — Z01419 Encounter for gynecological examination (general) (routine) without abnormal findings: Secondary | ICD-10-CM | POA: Diagnosis not present

## 2023-05-23 DIAGNOSIS — Z01411 Encounter for gynecological examination (general) (routine) with abnormal findings: Secondary | ICD-10-CM | POA: Diagnosis not present

## 2023-05-23 DIAGNOSIS — Z131 Encounter for screening for diabetes mellitus: Secondary | ICD-10-CM | POA: Diagnosis not present

## 2023-05-23 DIAGNOSIS — F419 Anxiety disorder, unspecified: Secondary | ICD-10-CM | POA: Diagnosis not present

## 2023-05-23 DIAGNOSIS — Z13220 Encounter for screening for lipoid disorders: Secondary | ICD-10-CM | POA: Diagnosis not present

## 2023-05-23 DIAGNOSIS — B009 Herpesviral infection, unspecified: Secondary | ICD-10-CM | POA: Diagnosis not present

## 2023-06-09 DIAGNOSIS — H6501 Acute serous otitis media, right ear: Secondary | ICD-10-CM | POA: Diagnosis not present

## 2023-06-16 DIAGNOSIS — F4310 Post-traumatic stress disorder, unspecified: Secondary | ICD-10-CM | POA: Diagnosis not present

## 2023-06-27 ENCOUNTER — Ambulatory Visit: Payer: BC Managed Care – PPO | Admitting: Physician Assistant

## 2023-06-27 ENCOUNTER — Encounter: Payer: Self-pay | Admitting: Physician Assistant

## 2023-06-27 VITALS — BP 118/68 | HR 72 | Resp 16 | Ht 66.0 in | Wt 155.0 lb

## 2023-06-27 DIAGNOSIS — T3695XA Adverse effect of unspecified systemic antibiotic, initial encounter: Secondary | ICD-10-CM

## 2023-06-27 DIAGNOSIS — H66004 Acute suppurative otitis media without spontaneous rupture of ear drum, recurrent, right ear: Secondary | ICD-10-CM | POA: Diagnosis not present

## 2023-06-27 DIAGNOSIS — B379 Candidiasis, unspecified: Secondary | ICD-10-CM | POA: Diagnosis not present

## 2023-06-27 MED ORDER — AMOXICILLIN-POT CLAVULANATE ER 1000-62.5 MG PO TB12: 2.0000 | ORAL_TABLET | Freq: Two times a day (BID) | ORAL | 0 refills | Status: DC

## 2023-06-27 MED ORDER — FLUCONAZOLE 150 MG PO TABS: 150.0000 mg | ORAL_TABLET | ORAL | 0 refills | Status: AC | PRN

## 2023-06-27 NOTE — Progress Notes (Signed)
Acute Office Visit   Patient: Laura Johnson   DOB: [birth year]   27 y.o. Female  MRN: [id number] Visit Date: 06/27/2023  Today's healthcare provider: Oswaldo Conroy Lizanne Erker, PA-C  Introduced myself to the patient as a Secondary school teacher and provided education on APPs in clinical practice.    Chief Complaint  Patient presents with   Ear Pain    R, x2 weeks. "Sharp" pain. Saw urgent care previously and was given antibiotics.   Subjective    HPI HPI     Ear Pain    Additional comments: R, x2 weeks. "Sharp" pain. Saw urgent care previously and was given antibiotics.      Last edited by Laura Johnson, CMA on 06/27/2023  9:38 AM.      States she went to [facility] on black Friday for ear pain - was treated with Augmentin and finished course  Reports she thought it was better until yesterday  Symptoms progressed today to include pain, decreased hearing and fullness in right ear   She denies periauricular pain or swelling    Medications: Outpatient Medications Prior to Visit  Medication Sig   ALPRAZolam (XANAX) 0.5 MG tablet Take 1 tablet (0.5 mg total) by mouth 2 (two) times daily as needed for anxiety.   cyclobenzaprine (FLEXERIL) 10 MG tablet Take 10 mg by mouth 3 (three) times daily as needed for muscle spasms.   escitalopram (LEXAPRO) 10 MG tablet TAKE 1 TABLET BY MOUTH EVERY DAY   ibuprofen (ADVIL) 200 MG tablet Take 400 mg by mouth every 6 (six) hours as needed for mild pain or headache.   norethindrone-ethinyl estradiol (JUNEL 1/20) 1-20 MG-MCG tablet Take 1 tablet by mouth daily.   No facility-administered medications prior to visit.    Review of Systems  Constitutional:  Negative for chills and fever.  HENT:  Positive for ear pain and hearing loss. Negative for sore throat.         Objective    BP 118/68   Pulse 72   Resp 16   Ht 5\' 6"  (1.676 m)   Wt 155 lb (70.3 kg)   SpO2 98%   BMI 25.02 kg/m     Physical Exam Vitals reviewed.  Constitutional:      General:  She is awake.     Appearance: Normal appearance. She is well-developed and well-groomed.  HENT:     Head: Normocephalic and atraumatic.     Right Ear: Hearing and ear canal normal. Tympanic membrane is erythematous and bulging.     Left Ear: Hearing, tympanic membrane and ear canal normal.     Mouth/Throat:     Lips: Pink.     Mouth: Mucous membranes are moist.     Pharynx: Uvula midline. No pharyngeal swelling, oropharyngeal exudate, posterior oropharyngeal erythema, uvula swelling or postnasal drip.  Pulmonary:     Effort: Pulmonary effort is normal.  Musculoskeletal:     Cervical back: Normal range of motion.  Skin:    General: Skin is warm and dry.  Neurological:     General: No focal deficit present.     Mental Status: She is alert and oriented to person, place, and time.  Psychiatric:        Mood and Affect: Mood normal.        Behavior: Behavior normal. Behavior is cooperative.        Thought Content: Thought content normal.        Judgment: Judgment normal.  No results found for any visits on 06/27/23.  Assessment & Plan      No follow-ups on file.       Problem List Items Addressed This Visit   None Visit Diagnoses       Recurrent acute suppurative otitis media of right ear without spontaneous rupture of tympanic membrane    -  Primary   Relevant Medications   amoxicillin-clavulanate (AUGMENTIN XR) 1000-62.5 MG 12 hr tablet   fluconazole (DIFLUCAN) 150 MG tablet     Antibiotic-induced yeast infection       Relevant Medications   fluconazole (DIFLUCAN) 150 MG tablet      Acute, recurrent concern Patient reports on the day after Thanksgiving she was seen at urgent care for ear pain which turned out to be otitis media.  She was treated with Augmentin p.o. twice daily x 7 days and reports overall resolution of symptoms until yesterday. Physical exam is notable for erythematous, bulging TM that appears consistent with otitis media Will try treating with  high-dose Augmentin 2000-125 mg p.o. twice daily x 10 days.  Reviewed that if she is not having improvement in symptoms in the next 48 to 72 hours she should let office know as she may need referral to ENT Reviewed that she can alternate Tylenol and ibuprofen as needed for pain management Follow-up as needed for progressing or persistent symptoms   No follow-ups on file.   I, Laura Mealor E Kullen Tomasetti, PA-C, have reviewed all documentation for this visit. The documentation on 06/27/23 for the exam, diagnosis, procedures, and orders are all accurate and complete.   Laura Johnson, MHS, PA-C [facility]

## 2023-06-28 ENCOUNTER — Telehealth: Payer: Self-pay

## 2023-06-28 NOTE — Telephone Encounter (Signed)
They accidentally sent this to [place]  Thanks   Copied from CRM [id number]. Topic: General - Other >> Jun 28, 2023  2:34 PM Everette C wrote: Reason for CRM: The patient has called to request an alternative prescription when possible for theiramoxicillin-clavulanate (AUGMENTIN XR) 1000-62.5 MG 12 hr tablet [[id number]]  The patient has been told that the medication will be more than $100 with their insurance and unavailable until tomorrow potentially   Please contact further when possible to discuss potential options

## 2023-06-29 ENCOUNTER — Other Ambulatory Visit: Payer: Self-pay | Admitting: Physician Assistant

## 2023-06-29 DIAGNOSIS — H66004 Acute suppurative otitis media without spontaneous rupture of ear drum, recurrent, right ear: Secondary | ICD-10-CM

## 2023-06-29 MED ORDER — DOXYCYCLINE HYCLATE 100 MG PO TABS: 100.0000 mg | ORAL_TABLET | Freq: Two times a day (BID) | ORAL | 0 refills | Status: AC

## 2023-06-29 NOTE — Progress Notes (Deleted)
Established Patient Office Visit  Name: Laura Johnson   MRN: [id number]    DOB: April 25, 1996   Date:06/29/2023  Today's Provider: Jacquelin Hawking, MHS, PA-C Introduced myself to the patient as a PA-C and provided education on APPs in clinical practice.         Subjective  Chief Complaint  No chief complaint on file.   HPI   Patient Active Problem List   Diagnosis Date Noted   Syncope 12/17/2021   Anxiety 12/17/2021   History of seizures 12/17/2021   History of anemia 12/17/2021   Menorrhagia with irregular cycle 03/20/2014   Herpes genitalis 03/20/2014    Past Surgical History:  Procedure Laterality Date   BREAST SURGERY     TONSILLECTOMY     TONSILLECTOMY AND ADENOIDECTOMY  07/11/2012   Dr. Chestine Spore    Family History  Problem Relation Age of Onset   Arthritis Mother    Anxiety disorder Mother    Depression Father    Supraventricular tachycardia Maternal Aunt    Heart attack Maternal Aunt    Anxiety disorder Maternal Grandmother    Leukemia Maternal Grandmother        7 yrs   Atrial fibrillation Maternal Grandmother    Dementia Maternal Grandfather        1.5 yrs    Social History   Tobacco Use   Smoking status: Former    Types: Cigarettes, E-cigarettes   Smokeless tobacco: Never  Substance Use Topics   Alcohol use: Yes    Alcohol/week: 1.0 - 2.0 standard drink of alcohol    Types: 1 - 2 Glasses of wine per week    Comment: on occ     Current Outpatient Medications:    ALPRAZolam (XANAX) 0.5 MG tablet, Take 1 tablet (0.5 mg total) by mouth 2 (two) times daily as needed for anxiety., Disp: 10 tablet, Rfl: 0   amoxicillin-clavulanate (AUGMENTIN XR) 1000-62.5 MG 12 hr tablet, Take 2 tablets by mouth 2 (two) times daily for 10 days., Disp: 40 tablet, Rfl: 0   cyclobenzaprine (FLEXERIL) 10 MG tablet, Take 10 mg by mouth 3 (three) times daily as needed for muscle spasms., Disp: , Rfl:    escitalopram (LEXAPRO) 10 MG tablet, TAKE 1 TABLET BY MOUTH  EVERY DAY, Disp: 90 tablet, Rfl: 2   fluconazole (DIFLUCAN) 150 MG tablet, Take 1 tablet (150 mg total) by mouth every three (3) days as needed. May repeat in 3 days if symptoms not resolved, Disp: 2 tablet, Rfl: 0   ibuprofen (ADVIL) 200 MG tablet, Take 400 mg by mouth every 6 (six) hours as needed for mild pain or headache., Disp: , Rfl:    norethindrone-ethinyl estradiol (JUNEL 1/20) 1-20 MG-MCG tablet, Take 1 tablet by mouth daily., Disp: , Rfl:   Allergies  Allergen Reactions   Celery (Apium Graveolens Var. Dulce) Skin Test Swelling    Throat swelling   Watermelon Flavoring Agent (Non-Screening) Itching    throat   Wild Lettuce Extract (Lactuca Virosa) Hives    I personally reviewed {Reviewed:14835} with the patient/caregiver today.   ROS    Objective  There were no vitals filed for this visit.  There is no height or weight on file to calculate BMI.  Physical Exam   No results found for this or any previous visit (from the past 2160 hours).   PHQ2/9:    06/27/2023    9:38 AM 12/17/2021    1:06 PM  Depression  screen PHQ 2/9  Decreased Interest 0 0  Down, Depressed, Hopeless 0 0  PHQ - 2 Score 0 0  Altered sleeping  2  Tired, decreased energy  2  Change in appetite  1  Feeling bad or failure about yourself   1  Trouble concentrating  0  Moving slowly or fidgety/restless  0  Suicidal thoughts  0  PHQ-9 Score  6  Difficult doing work/chores  Not difficult at all      Fall Risk:    06/27/2023    9:38 AM 12/17/2021    1:06 PM  Fall Risk   Falls in the past year? 0 0  Number falls in past yr: 0 0  Injury with Fall? 0 0  Risk for fall due to : No Fall Risks No Fall Risks  Follow up Falls prevention discussed       Functional Status Survey:      Assessment & Plan

## 2023-08-14 ENCOUNTER — Other Ambulatory Visit: Payer: Self-pay | Admitting: Nurse Practitioner

## 2023-08-14 ENCOUNTER — Telehealth: Payer: BC Managed Care – PPO | Admitting: Nurse Practitioner

## 2023-08-14 ENCOUNTER — Encounter: Payer: Self-pay | Admitting: Nurse Practitioner

## 2023-08-14 ENCOUNTER — Telehealth: Payer: Self-pay | Admitting: Nurse Practitioner

## 2023-08-14 VITALS — Temp 102.0°F

## 2023-08-14 DIAGNOSIS — R6889 Other general symptoms and signs: Secondary | ICD-10-CM

## 2023-08-14 MED ORDER — OSELTAMIVIR PHOSPHATE 75 MG PO CAPS: 75.0000 mg | ORAL_CAPSULE | Freq: Two times a day (BID) | ORAL | 0 refills | Status: AC

## 2023-08-14 MED ORDER — HYDROCOD POLI-CHLORPHE POLI ER 10-8 MG/5ML PO SUER: 5.0000 mL | Freq: Two times a day (BID) | ORAL | 0 refills | Status: DC | PRN

## 2023-08-14 MED ORDER — HYDROCOD POLI-CHLORPHE POLI ER 10-8 MG/5ML PO SUER: 5.0000 mL | Freq: Two times a day (BID) | ORAL | 0 refills | Status: AC | PRN

## 2023-08-14 MED ORDER — BENZONATATE 100 MG PO CAPS: 200.0000 mg | ORAL_CAPSULE | Freq: Three times a day (TID) | ORAL | 0 refills | Status: AC | PRN

## 2023-08-14 NOTE — Addendum Note (Signed)
Addended by: Della Goo F on: 08/14/2023 01:50 PM   Modules accepted: Orders

## 2023-08-14 NOTE — Progress Notes (Signed)
Name: Laura Johnson   MRN: [id number]    DOB: 03/20/1996   Date:08/14/2023       Progress Note  Subjective  Chief Complaint  Chief Complaint  Patient presents with   URI    Tempeture, chills, headache, congestion, cough and sore throat.  Onset 2 days was exposed to flu    I connected with  Laura Johnson  on 08/14/23 at  1:00 PM EST by a video enabled telemedicine application and verified that I am speaking with the correct person using two identifiers.  I discussed the limitations of evaluation and management by telemedicine and the availability of in person appointments. The patient expressed understanding and agreed to proceed with a virtual visit  Staff also discussed with the patient that there may be a patient responsible charge related to this service. Patient Location: home Provider Location: [facility] Additional Individuals present: np student  HPI  Discussed the use of AI scribe software for clinical note transcription with the patient, who gave verbal consent to proceed.  History of Present Illness   The patient, with a history of influenza exposure, presents with symptoms that began on Sunday. She describes a sensation of a 'lighter in my throat,' severe headache, and chills. The headache is so severe that it feels like a 'pulse is in my ear' if not managed with Tylenol. She also reports a fever of 102.8 degrees Fahrenheit. The patient was exposed to friends who recently recovered from the flu. She denies any previous adverse reactions to Tamiflu.    Patient Active Problem List   Diagnosis Date Noted   Syncope 12/17/2021   Anxiety 12/17/2021   History of seizures 12/17/2021   History of anemia 12/17/2021   Menorrhagia with irregular cycle 03/20/2014   Herpes genitalis 03/20/2014    Social History   Tobacco Use   Smoking status: Former    Types: Cigarettes, E-cigarettes   Smokeless tobacco: Never  Substance Use Topics   Alcohol use: Yes    Alcohol/week:  1.0 - 2.0 standard drink of alcohol    Types: 1 - 2 Glasses of wine per week    Comment: on occ     Current Outpatient Medications:    ALPRAZolam (XANAX) 0.5 MG tablet, Take 1 tablet (0.5 mg total) by mouth 2 (two) times daily as needed for anxiety., Disp: 10 tablet, Rfl: 0   benzonatate (TESSALON PERLES) 100 MG capsule, Take 2 capsules (200 mg total) by mouth 3 (three) times daily as needed for cough., Disp: 20 capsule, Rfl: 0   chlorpheniramine-HYDROcodone (TUSSIONEX) 10-8 MG/5ML, Take 5 mLs by mouth every 12 (twelve) hours as needed for cough., Disp: 120 mL, Rfl: 0   cyclobenzaprine (FLEXERIL) 10 MG tablet, Take 10 mg by mouth 3 (three) times daily as needed for muscle spasms., Disp: , Rfl:    escitalopram (LEXAPRO) 10 MG tablet, TAKE 1 TABLET BY MOUTH EVERY DAY, Disp: 90 tablet, Rfl: 2   fluconazole (DIFLUCAN) 150 MG tablet, Take 1 tablet (150 mg total) by mouth every three (3) days as needed. May repeat in 3 days if symptoms not resolved, Disp: 2 tablet, Rfl: 0   ibuprofen (ADVIL) 200 MG tablet, Take 400 mg by mouth every 6 (six) hours as needed for mild pain or headache., Disp: , Rfl:    norethindrone-ethinyl estradiol (JUNEL 1/20) 1-20 MG-MCG tablet, Take 1 tablet by mouth daily., Disp: , Rfl:    oseltamivir (TAMIFLU) 75 MG capsule, Take 1 capsule (75 mg total) by  mouth 2 (two) times daily for 5 days., Disp: 10 capsule, Rfl: 0  Allergies  Allergen Reactions   Celery (Apium Graveolens Var. Dulce) Skin Test Swelling    Throat swelling   Watermelon Flavoring Agent (Non-Screening) Itching    throat   Wild Lettuce Extract (Lactuca Virosa) Hives    I personally reviewed active problem list, medication list, allergies, notes from last encounter with the patient/caregiver today.  ROS  Ten systems reviewed and is negative except as mentioned in HPI   Objective  Virtual encounter, vitals not obtained.  There is no height or weight on file to calculate BMI.  Nursing Note and Vital  Signs reviewed.  Physical Exam  Awake, alert and oriented, speaking in complete sentences   No results found for this or any previous visit (from the past 72 hours).  Assessment & Plan  Problem List Items Addressed This Visit   None Visit Diagnoses       Flu-like symptoms    -  Primary   Relevant Medications   oseltamivir (TAMIFLU) 75 MG capsule   benzonatate (TESSALON PERLES) 100 MG capsule   chlorpheniramine-HYDROcodone (TUSSIONEX) 10-8 MG/5ML      Assessment and Plan    Influenza Recent exposure to friends with flu, presenting with symptoms of sore throat, cough, headache, and fever. No known allergies or adverse reactions to Tamiflu. -Start Tamiflu. -Over-the-counter recommendations:Zyrtec or Claritin, Flonase, and Mucinex. -Prescribe Tessalon Perles and Phenergan DM and tamiflu -push fluids and get rest -continue tylenol/motrin for fever         -Red flags and when to present for emergency care or RTC including fever >101.10F, chest pain, shortness of breath, new/worsening/un-resolving symptoms,  reviewed with patient at time of visit. Follow up and care instructions discussed and provided in AVS. - I discussed the assessment and treatment plan with the patient. The patient was provided an opportunity to ask questions and all were answered. The patient agreed with the plan and demonstrated an understanding of the instructions.  I provided 15 minutes of non-face-to-face time during this encounter.  Berniece Salines, FNP

## 2023-08-14 NOTE — Telephone Encounter (Signed)
Patient had a virtual visit with Della Goo, NP today.   Pharmacy does not have chlorpheniramine-HYDROcodone (TUSSIONEX) 10-8 MG/5ML in stock. Patient would like PCP to send in medication to  [facility]  [address], [place], [place] [address]  [phone]

## 2024-01-07 ENCOUNTER — Other Ambulatory Visit: Payer: Self-pay | Admitting: Physician Assistant

## 2024-01-24 ENCOUNTER — Ambulatory Visit: Payer: Self-pay

## 2024-01-24 NOTE — Telephone Encounter (Signed)
 FYI Only or Action Required?: FYI only for provider.  Patient was last seen in primary care on 08/14/2023 by Gareth Mliss FALCON, FNP.  Called Nurse Triage reporting Anxiety, Memory Loss, Palpitations, and Dizziness.  Symptoms began several days ago.  Interventions attempted: Prescription medications: Xanax , Lexapro  and Dietary changes.  Symptoms are: chest pain, dizziness, palpitations, anxiety gradually improving.  Triage Disposition: Go to ED Now (or PCP Triage)  Patient/caregiver understands and will follow disposition?: Yes   Called [facility] and confirmed okay to route message to [facility] and they will assist patient with [facility] appointment since her PCP Drubel has left the practice.      Copied from CRM [id number]. Topic: Clinical - Red Word Triage >> Jan 24, 2024  9:07 AM Treva T wrote: [facility] that prompted transfer to Nurse Triage: Patient calling, states she is experiencing increased anxiety, loss of memory, and feeling delusional, and heart palpitations, and dizziness. Noticed symptoms after returning from trip out of town. Reason for Disposition  [1] Chest pain lasts > 5 minutes AND [2] occurred in past 3 days (72 hours) (Exception: Feels exactly the same as previously diagnosed heartburn and has accompanying sour taste in mouth.)  Answer Assessment - Initial Assessment Questions 1. DESCRIPTION: Please describe your heart rate or heartbeat that you are having (e.g., fast/slow, regular/irregular, skipped or extra beats, palpitations)     Heart started palpitating really fast, she states during the episode her pupil dilated and she had tingling throughout her body.  2. ONSET: When did it start? (e.g., minutes, hours, days)      Sunday 0400, while sitting in the car.  3. DURATION: How long does it last (e.g., seconds, minutes, hours)     Unsure, she states she thinks 30 minutes.  4. PATTERN Does it come and go, or has it been constant since it started?  Does it get worse with  exertion?   Are you feeling it now?     Comes and goes. Not feeling it right now.  5. TAP: Using your hand, can you tap out what you are feeling on a chair or table in front of you, so that I can hear? Note: Not all patients can do this.       N/A.  6. HEART RATE: Can you tell me your heart rate? How many beats in 15 seconds?  Note: Not all patients can do this.       She states she has not been able to track her heart rate.  7. RECURRENT SYMPTOM: Have you ever had this before? If Yes, ask: When was the last time? and What happened that time?      Yes, 8 months since her last episode. She states she would lay on the ground and elevate her legs. She states she also was treated with Lexapro . She states she was prescribed Xanax  when she flies and she states she took one the other night and it helped her sleep.  8. CAUSE: What do you think is causing the palpitations?     She states just before the episode she had drank [facility]. She states she knows caffeine is causing it so she is trying to avoid it.  9. CARDIAC HISTORY: Do you have any history of heart disease? (e.g., heart attack, angina, bypass surgery, angioplasty, arrhythmia)      POTS, vasovagal syncope.  10. OTHER SYMPTOMS: Do you have any other symptoms? (e.g., dizziness, chest pain, sweating, difficulty breathing)  She states it felt like she was going to lose control of her bowel and bladder after the episode (she states she did not), anxiety, mild dizziness (describes it as sea legs, feels like she is on a cruise), chest pain earlier this week an episode (lasted 2 days and would fluctuate in terms of how hard and tight the pain would get).  11. PREGNANCY: Is there any chance you are pregnant? When was your last menstrual period?       LMP: 5 years ago, she states she is on birth control.  Protocols used: Heart Rate and Heartbeat Questions-A-AH, Chest Pain-A-AH

## 2024-02-16 DIAGNOSIS — R55 Syncope and collapse: Secondary | ICD-10-CM | POA: Diagnosis not present

## 2024-02-28 ENCOUNTER — Encounter: Payer: Self-pay | Admitting: Neurology

## 2024-03-15 DIAGNOSIS — D2239 Melanocytic nevi of other parts of face: Secondary | ICD-10-CM | POA: Diagnosis not present

## 2024-03-15 DIAGNOSIS — D225 Melanocytic nevi of trunk: Secondary | ICD-10-CM | POA: Diagnosis not present

## 2024-03-15 DIAGNOSIS — L578 Other skin changes due to chronic exposure to nonionizing radiation: Secondary | ICD-10-CM | POA: Diagnosis not present

## 2024-03-15 DIAGNOSIS — L814 Other melanin hyperpigmentation: Secondary | ICD-10-CM | POA: Diagnosis not present

## 2024-05-02 ENCOUNTER — Ambulatory Visit: Admitting: Neurology

## 2024-05-31 DIAGNOSIS — Z113 Encounter for screening for infections with a predominantly sexual mode of transmission: Secondary | ICD-10-CM | POA: Diagnosis not present

## 2024-05-31 DIAGNOSIS — Z01419 Encounter for gynecological examination (general) (routine) without abnormal findings: Secondary | ICD-10-CM | POA: Diagnosis not present

## 2024-05-31 DIAGNOSIS — Z1331 Encounter for screening for depression: Secondary | ICD-10-CM | POA: Diagnosis not present

## 2024-05-31 DIAGNOSIS — Z124 Encounter for screening for malignant neoplasm of cervix: Secondary | ICD-10-CM | POA: Diagnosis not present

## 2024-06-21 ENCOUNTER — Ambulatory Visit: Admitting: Neurology

## 2024-08-30 ENCOUNTER — Ambulatory Visit: Admitting: Neurology
